# Patient Record
Sex: Female | Born: 1965 | Race: White | Hispanic: No | Marital: Single | Smoking: Never smoker
Health system: Southern US, Community
[De-identification: ages and names within clinical notes are randomized; demographics above are authoritative.]

## PROBLEM LIST (undated history)

## (undated) DIAGNOSIS — L719 Rosacea, unspecified: Secondary | ICD-10-CM

## (undated) DIAGNOSIS — E559 Vitamin D deficiency, unspecified: Secondary | ICD-10-CM

## (undated) DIAGNOSIS — F338 Other recurrent depressive disorders: Secondary | ICD-10-CM

## (undated) DIAGNOSIS — M722 Plantar fascial fibromatosis: Secondary | ICD-10-CM

## (undated) DIAGNOSIS — L111 Transient acantholytic dermatosis [Grover]: Secondary | ICD-10-CM

## (undated) DIAGNOSIS — M1990 Unspecified osteoarthritis, unspecified site: Secondary | ICD-10-CM

## (undated) HISTORY — DX: Other recurrent depressive disorders: F33.8

## (undated) HISTORY — PX: HERNIA REPAIR: SHX51

## (undated) HISTORY — DX: Rosacea, unspecified: L71.9

## (undated) HISTORY — DX: Transient acantholytic dermatosis (grover): L11.1

## (undated) HISTORY — PX: MANDIBLE SURGERY: SHX707

## (undated) HISTORY — DX: Unspecified osteoarthritis, unspecified site: M19.90

## (undated) HISTORY — DX: Vitamin D deficiency, unspecified: E55.9

## (undated) HISTORY — DX: Plantar fascial fibromatosis: M72.2

## (undated) HISTORY — PX: TONSILLECTOMY AND ADENOIDECTOMY: SUR1326

---
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
## 2001-08-15 ENCOUNTER — Other Ambulatory Visit: Admission: RE | Admit: 2001-08-15 | Discharge: 2001-08-15 | Payer: Self-pay | Admitting: Family Medicine

## 2006-06-11 ENCOUNTER — Encounter: Payer: Self-pay | Admitting: Family Medicine

## 2006-07-10 ENCOUNTER — Encounter: Payer: Self-pay | Admitting: Family Medicine

## 2007-12-16 ENCOUNTER — Encounter: Payer: Self-pay | Admitting: Family Medicine

## 2007-12-16 ENCOUNTER — Encounter (INDEPENDENT_AMBULATORY_CARE_PROVIDER_SITE_OTHER): Payer: Self-pay | Admitting: *Deleted

## 2007-12-16 LAB — CONVERTED CEMR LAB
ALT: 46 units/L
AST: 25 units/L
Albumin: 4.5 g/dL
Alkaline Phosphatase: 60 units/L
BUN: 13 mg/dL
CO2, serum: 22 mmol/L
Calcium: 9.6 mg/dL
Chloride, Serum: 103 mmol/L
Cholesterol: 166 mg/dL
Creatinine, Ser: 0.91 mg/dL
Globulin: 2.7 g/dL
Glucose, Bld: 88 mg/dL
HDL: 52 mg/dL
LDL Cholesterol: 99 mg/dL
Pap Smear: NORMAL
Potassium, serum: 3.9 mmol/L
Sodium, serum: 138 mmol/L
TSH: 1.96 microintl units/mL
Total Bilirubin: 0.4 mg/dL
Total Protein: 7.2 g/dL
Triglycerides: 73 mg/dL

## 2008-08-29 ENCOUNTER — Encounter: Admission: RE | Admit: 2008-08-29 | Discharge: 2008-08-29 | Payer: Self-pay | Admitting: Orthopedic Surgery

## 2008-12-31 ENCOUNTER — Encounter (INDEPENDENT_AMBULATORY_CARE_PROVIDER_SITE_OTHER): Payer: Self-pay | Admitting: *Deleted

## 2009-01-01 ENCOUNTER — Ambulatory Visit: Payer: Self-pay | Admitting: Family Medicine

## 2009-01-01 ENCOUNTER — Encounter: Payer: Self-pay | Admitting: Family Medicine

## 2009-01-01 ENCOUNTER — Other Ambulatory Visit: Admission: RE | Admit: 2009-01-01 | Discharge: 2009-01-01 | Payer: Self-pay | Admitting: Family Medicine

## 2009-01-01 DIAGNOSIS — L719 Rosacea, unspecified: Secondary | ICD-10-CM | POA: Insufficient documentation

## 2009-01-01 HISTORY — DX: Rosacea, unspecified: L71.9

## 2009-01-11 ENCOUNTER — Encounter (INDEPENDENT_AMBULATORY_CARE_PROVIDER_SITE_OTHER): Payer: Self-pay | Admitting: *Deleted

## 2009-01-11 LAB — CONVERTED CEMR LAB
ALT: 23 units/L (ref 0–35)
AST: 20 units/L (ref 0–37)
Albumin: 4.2 g/dL (ref 3.5–5.2)
Alkaline Phosphatase: 56 units/L (ref 39–117)
BUN: 8 mg/dL (ref 6–23)
Basophils Absolute: 0.1 10*3/uL (ref 0.0–0.1)
Basophils Relative: 1.4 % (ref 0.0–3.0)
Bilirubin, Direct: 0 mg/dL (ref 0.0–0.3)
CO2: 30 meq/L (ref 19–32)
Calcium: 8.9 mg/dL (ref 8.4–10.5)
Chloride: 101 meq/L (ref 96–112)
Cholesterol: 177 mg/dL (ref 0–200)
Creatinine, Ser: 0.9 mg/dL (ref 0.4–1.2)
Eosinophils Absolute: 0.1 10*3/uL (ref 0.0–0.7)
Eosinophils Relative: 1.6 % (ref 0.0–5.0)
GFR calc non Af Amer: 71.92 mL/min (ref >60)
Glucose, Bld: 92 mg/dL (ref 70–99)
HCT: 40.1 % (ref 36.0–46.0)
HDL: 41.5 mg/dL (ref >39.00)
Hemoglobin: 14.2 g/dL (ref 12.0–15.0)
LDL Cholesterol: 121 mg/dL — ABNORMAL HIGH (ref 0–99)
Lymphocytes Relative: 22.7 % (ref 12.0–46.0)
Lymphs Abs: 1.3 10*3/uL (ref 0.7–4.0)
MCHC: 35.3 g/dL (ref 30.0–36.0)
MCV: 95.6 fL (ref 78.0–100.0)
Monocytes Absolute: 0.5 10*3/uL (ref 0.1–1.0)
Monocytes Relative: 8.9 % (ref 3.0–12.0)
Neutro Abs: 3.7 10*3/uL (ref 1.4–7.7)
Neutrophils Relative %: 65.4 % (ref 43.0–77.0)
Platelets: 256 10*3/uL (ref 150.0–400.0)
Potassium: 4 meq/L (ref 3.5–5.1)
RBC: 4.2 M/uL (ref 3.87–5.11)
RDW: 11.7 % (ref 11.5–14.6)
Sodium: 141 meq/L (ref 135–145)
TSH: 1.18 microintl units/mL (ref 0.35–5.50)
Total Bilirubin: 0.9 mg/dL (ref 0.3–1.2)
Total CHOL/HDL Ratio: 4
Total Protein: 7.6 g/dL (ref 6.0–8.3)
Triglycerides: 74 mg/dL (ref 0.0–149.0)
VLDL: 14.8 mg/dL (ref 0.0–40.0)
WBC: 5.7 10*3/uL (ref 4.5–10.5)

## 2010-03-24 ENCOUNTER — Other Ambulatory Visit
Admission: RE | Admit: 2010-03-24 | Discharge: 2010-03-24 | Payer: Self-pay | Source: Home / Self Care | Admitting: Internal Medicine

## 2010-03-24 ENCOUNTER — Other Ambulatory Visit: Payer: Self-pay

## 2012-05-21 ENCOUNTER — Other Ambulatory Visit: Payer: Self-pay | Admitting: Dermatology

## 2012-10-15 ENCOUNTER — Ambulatory Visit (HOSPITAL_COMMUNITY)
Admission: RE | Admit: 2012-10-15 | Discharge: 2012-10-15 | Disposition: A | Discharge status: Home / Self Care | Payer: BC Managed Care – PPO | Source: Ambulatory Visit | Attending: Physician Assistant | Admitting: Physician Assistant

## 2012-10-15 ENCOUNTER — Other Ambulatory Visit (HOSPITAL_COMMUNITY): Payer: Self-pay | Admitting: Physician Assistant

## 2012-10-15 DIAGNOSIS — M47812 Spondylosis without myelopathy or radiculopathy, cervical region: Secondary | ICD-10-CM | POA: Insufficient documentation

## 2012-10-15 DIAGNOSIS — M5030 Other cervical disc degeneration, unspecified cervical region: Secondary | ICD-10-CM | POA: Insufficient documentation

## 2012-10-15 DIAGNOSIS — S139XXA Sprain of joints and ligaments of unspecified parts of neck, initial encounter: Secondary | ICD-10-CM

## 2012-10-15 DIAGNOSIS — M542 Cervicalgia: Secondary | ICD-10-CM | POA: Insufficient documentation

## 2012-10-23 ENCOUNTER — Other Ambulatory Visit: Payer: Self-pay | Admitting: Internal Medicine

## 2012-10-23 DIAGNOSIS — M542 Cervicalgia: Secondary | ICD-10-CM

## 2012-10-31 ENCOUNTER — Other Ambulatory Visit: Payer: BC Managed Care – PPO

## 2013-05-02 ENCOUNTER — Encounter: Payer: Self-pay | Admitting: Physician Assistant

## 2013-05-02 ENCOUNTER — Other Ambulatory Visit (HOSPITAL_COMMUNITY)
Admission: RE | Admit: 2013-05-02 | Discharge: 2013-05-02 | Disposition: A | Discharge status: Home / Self Care | Payer: BC Managed Care – PPO | Source: Ambulatory Visit | Attending: Physician Assistant | Admitting: Physician Assistant

## 2013-05-02 ENCOUNTER — Ambulatory Visit (INDEPENDENT_AMBULATORY_CARE_PROVIDER_SITE_OTHER): Payer: BC Managed Care – PPO | Admitting: Physician Assistant

## 2013-05-02 VITALS — BP 110/62 | HR 64 | Temp 97.9°F | Resp 16 | Ht 65.0 in | Wt 159.0 lb

## 2013-05-02 DIAGNOSIS — Z124 Encounter for screening for malignant neoplasm of cervix: Secondary | ICD-10-CM | POA: Insufficient documentation

## 2013-05-02 DIAGNOSIS — E559 Vitamin D deficiency, unspecified: Secondary | ICD-10-CM

## 2013-05-02 DIAGNOSIS — Z1151 Encounter for screening for human papillomavirus (HPV): Secondary | ICD-10-CM | POA: Insufficient documentation

## 2013-05-02 DIAGNOSIS — Z79899 Other long term (current) drug therapy: Secondary | ICD-10-CM

## 2013-05-02 DIAGNOSIS — L309 Dermatitis, unspecified: Secondary | ICD-10-CM

## 2013-05-02 DIAGNOSIS — Z0000 Encounter for general adult medical examination without abnormal findings: Secondary | ICD-10-CM

## 2013-05-02 LAB — MICROALBUMIN / CREATININE URINE RATIO
Creatinine, Urine: 149 mg/dL
Microalb Creat Ratio: 3.4 mg/g (ref 0.0–30.0)
Microalb, Ur: 0.5 mg/dL (ref 0.00–1.89)

## 2013-05-02 LAB — URINALYSIS, ROUTINE W REFLEX MICROSCOPIC
Bilirubin Urine: NEGATIVE
Glucose, UA: NEGATIVE mg/dL
Ketones, ur: NEGATIVE mg/dL
Leukocytes, UA: NEGATIVE
NITRITE: NEGATIVE
Protein, ur: NEGATIVE mg/dL
Specific Gravity, Urine: 1.019 (ref 1.005–1.030)
UROBILINOGEN UA: 0.2 mg/dL (ref 0.0–1.0)
pH: 5.5 (ref 5.0–8.0)

## 2013-05-02 LAB — URINALYSIS, MICROSCOPIC ONLY
Bacteria, UA: NONE SEEN
Casts: NONE SEEN
Crystals: NONE SEEN
SQUAMOUS EPITHELIAL / LPF: NONE SEEN

## 2013-05-02 LAB — HEMOGLOBIN A1C
HEMOGLOBIN A1C: 5.4 % (ref <5.7)
Mean Plasma Glucose: 108 mg/dL (ref <117)

## 2013-05-02 LAB — CBC WITH DIFFERENTIAL/PLATELET
BASOS ABS: 0 10*3/uL (ref 0.0–0.1)
Basophils Relative: 0 % (ref 0–1)
EOS PCT: 2 % (ref 0–5)
Eosinophils Absolute: 0.1 10*3/uL (ref 0.0–0.7)
HEMATOCRIT: 40.9 % (ref 36.0–46.0)
Hemoglobin: 14.1 g/dL (ref 12.0–15.0)
LYMPHS PCT: 28 % (ref 12–46)
Lymphs Abs: 1.4 10*3/uL (ref 0.7–4.0)
MCH: 31.1 pg (ref 26.0–34.0)
MCHC: 34.5 g/dL (ref 30.0–36.0)
MCV: 90.3 fL (ref 78.0–100.0)
MONO ABS: 0.5 10*3/uL (ref 0.1–1.0)
Monocytes Relative: 10 % (ref 3–12)
Neutro Abs: 3 10*3/uL (ref 1.7–7.7)
Neutrophils Relative %: 60 % (ref 43–77)
Platelets: 241 10*3/uL (ref 150–400)
RBC: 4.53 MIL/uL (ref 3.87–5.11)
RDW: 13.2 % (ref 11.5–15.5)
WBC: 5 10*3/uL (ref 4.0–10.5)

## 2013-05-02 LAB — HEPATIC FUNCTION PANEL
ALBUMIN: 4.4 g/dL (ref 3.5–5.2)
ALK PHOS: 56 U/L (ref 39–117)
ALT: 13 U/L (ref 0–35)
AST: 12 U/L (ref 0–37)
Bilirubin, Direct: 0.1 mg/dL (ref 0.0–0.3)
Indirect Bilirubin: 0.6 mg/dL (ref 0.2–1.2)
TOTAL PROTEIN: 7.1 g/dL (ref 6.0–8.3)
Total Bilirubin: 0.7 mg/dL (ref 0.2–1.2)

## 2013-05-02 LAB — LIPID PANEL
CHOLESTEROL: 167 mg/dL (ref 0–200)
HDL: 54 mg/dL (ref >39)
LDL Cholesterol: 100 mg/dL — ABNORMAL HIGH (ref 0–99)
Total CHOL/HDL Ratio: 3.1 Ratio
Triglycerides: 63 mg/dL (ref <150)
VLDL: 13 mg/dL (ref 0–40)

## 2013-05-02 LAB — VITAMIN B12: Vitamin B-12: 459 pg/mL (ref 211–911)

## 2013-05-02 LAB — FERRITIN: FERRITIN: 65 ng/mL (ref 10–291)

## 2013-05-02 LAB — BASIC METABOLIC PANEL WITH GFR
BUN: 11 mg/dL (ref 6–23)
CHLORIDE: 103 meq/L (ref 96–112)
CO2: 26 mEq/L (ref 19–32)
CREATININE: 0.88 mg/dL (ref 0.50–1.10)
Calcium: 9.4 mg/dL (ref 8.4–10.5)
GFR, EST NON AFRICAN AMERICAN: 77 mL/min
GFR, Est African American: 89 mL/min
Glucose, Bld: 83 mg/dL (ref 70–99)
Potassium: 4.4 mEq/L (ref 3.5–5.3)
Sodium: 137 mEq/L (ref 135–145)

## 2013-05-02 LAB — IRON AND TIBC
%SAT: 36 % (ref 20–55)
Iron: 113 ug/dL (ref 42–145)
TIBC: 314 ug/dL (ref 250–470)
UIBC: 201 ug/dL (ref 125–400)

## 2013-05-02 LAB — MAGNESIUM: Magnesium: 2 mg/dL (ref 1.5–2.5)

## 2013-05-02 LAB — TSH: TSH: 1.894 u[IU]/mL (ref 0.350–4.500)

## 2013-05-02 MED ORDER — TRIAMCINOLONE ACETONIDE 0.1 % EX CREA: 1.0000 "application " | TOPICAL_CREAM | Freq: Two times a day (BID) | CUTANEOUS | Status: DC

## 2013-05-02 NOTE — Patient Instructions (Addendum)
Preventative Care for Adults - Female      MAINTAIN REGULAR HEALTH EXAMS:  A routine yearly physical is a good way to check in with your primary care provider about your health and preventive screening. It is also an opportunity to share updates about your health and any concerns you have, and receive a thorough all-over exam.   Most health insurance companies pay for at least some preventative services.  Check with your health plan for specific coverages.  WHAT PREVENTATIVE SERVICES DO WOMEN NEED?  Adult women should have their weight and blood pressure checked regularly.   Women age 35 and older should have their cholesterol levels checked regularly.  Women should be screened for cervical cancer with a Pap smear and pelvic exam beginning at either age 21, or 3 years after they become sexually activity.    Breast cancer screening generally begins at age 40 with a mammogram and breast exam by your primary care provider.    Beginning at age 50 and continuing to age 75, women should be screened for colorectal cancer.  Certain people may need continued testing until age 85.  Updating vaccinations is part of preventative care.  Vaccinations help protect against diseases such as the flu.  Osteoporosis is a disease in which the bones lose minerals and strength as we age. Women ages 65 and over should discuss this with their caregivers, as should women after menopause who have other risk factors.  Lab tests are generally done as part of preventative care to screen for anemia and blood disorders, to screen for problems with the kidneys and liver, to screen for bladder problems, to check blood sugar, and to check your cholesterol level.  Preventative services generally include counseling about diet, exercise, avoiding tobacco, drugs, excessive alcohol consumption, and sexually transmitted infections.    GENERAL RECOMMENDATIONS FOR GOOD HEALTH:  Healthy diet:  Eat a variety of foods, including  fruit, vegetables, animal or vegetable protein, such as meat, fish, chicken, and eggs, or beans, lentils, tofu, and grains, such as rice.  Drink plenty of water daily.  Decrease saturated fat in the diet, avoid lots of red meat, processed foods, sweets, fast foods, and fried foods.  Exercise:  Aerobic exercise helps maintain good heart health. At least 30-40 minutes of moderate-intensity exercise is recommended. For example, a brisk walk that increases your heart rate and breathing. This should be done on most days of the week.   Find a type of exercise or a variety of exercises that you enjoy so that it becomes a part of your daily life.  Examples are running, walking, swimming, water aerobics, and biking.  For motivation and support, explore group exercise such as aerobic class, spin class, Zumba, Yoga,or  martial arts, etc.    Set exercise goals for yourself, such as a certain weight goal, walk or run in a race such as a 5k walk/run.  Speak to your primary care provider about exercise goals.  Disease prevention:  If you smoke or chew tobacco, find out from your caregiver how to quit. It can literally save your life, no matter how long you have been a tobacco user. If you do not use tobacco, never begin.   Maintain a healthy diet and normal weight. Increased weight leads to problems with blood pressure and diabetes.   The Body Mass Index or BMI is a way of measuring how much of your body is fat. Having a BMI above 27 increases the risk of heart disease,   diabetes, hypertension, stroke and other problems related to obesity. Your caregiver can help determine your BMI and based on it develop an exercise and dietary program to help you achieve or maintain this important measurement at a healthful level.  High blood pressure causes heart and blood vessel problems.  Persistent high blood pressure should be treated with medicine if weight loss and exercise do not work.   Fat and cholesterol leaves  deposits in your arteries that can block them. This causes heart disease and vessel disease elsewhere in your body.  If your cholesterol is found to be high, or if you have heart disease or certain other medical conditions, then you may need to have your cholesterol monitored frequently and be treated with medication.   Ask if you should have a cardiac stress test if your history suggests this. A stress test is a test done on a treadmill that looks for heart disease. This test can find disease prior to there being a problem.  Menopause can be associated with physical symptoms and risks. Hormone replacement therapy is available to decrease these. You should talk to your caregiver about whether starting or continuing to take hormones is right for you.   Osteoporosis is a disease in which the bones lose minerals and strength as we age. This can result in serious bone fractures. Risk of osteoporosis can be identified using a bone density scan. Women ages 65 and over should discuss this with their caregivers, as should women after menopause who have other risk factors. Ask your caregiver whether you should be taking a calcium supplement and Vitamin D, to reduce the rate of osteoporosis.   Avoid drinking alcohol in excess (more than two drinks per day).  Avoid use of street drugs. Do not share needles with anyone. Ask for professional help if you need assistance or instructions on stopping the use of alcohol, cigarettes, and/or drugs.  Brush your teeth twice a day with fluoride toothpaste, and floss once a day. Good oral hygiene prevents tooth decay and gum disease. The problems can be painful, unattractive, and can cause other health problems. Visit your dentist for a routine oral and dental check up and preventive care every 6-12 months.   Look at your skin regularly.  Use a mirror to look at your back. Notify your caregivers of changes in moles, especially if there are changes in shapes, colors, a size  larger than a pencil eraser, an irregular border, or development of new moles.  Safety:  Use seatbelts 100% of the time, whether driving or as a passenger.  Use safety devices such as hearing protection if you work in environments with loud noise or significant background noise.  Use safety glasses when doing any work that could send debris in to the eyes.  Use a helmet if you ride a bike or motorcycle.  Use appropriate safety gear for contact sports.  Talk to your caregiver about gun safety.  Use sunscreen with a SPF (or skin protection factor) of 15 or greater.  Lighter skinned people are at a greater risk of skin cancer. Don't forget to also wear sunglasses in order to protect your eyes from too much damaging sunlight. Damaging sunlight can accelerate cataract formation.   Practice safe sex. Use condoms. Condoms are used for birth control and to help reduce the spread of sexually transmitted infections (or STIs).  Some of the STIs are gonorrhea (the clap), chlamydia, syphilis, trichomonas, herpes, HPV (human papilloma virus) and HIV (human immunodeficiency virus)   which causes AIDS. The herpes, HIV and HPV are viral illnesses that have no cure. These can result in disability, cancer and death.   Keep carbon monoxide and smoke detectors in your home functioning at all times. Change the batteries every 6 months or use a model that plugs into the wall.   Vaccinations:  Stay up to date with your tetanus shots and other required immunizations. You should have a booster for tetanus every 10 years. Be sure to get your flu shot every year, since 5%-20% of the [place] population comes down with the flu. The flu vaccine changes each year, so being vaccinated once is not enough. Get your shot in the fall, before the flu season peaks.   Other vaccines to consider:  Human Papilloma Virus or HPV causes cancer of the cervix, and other infections that can be transmitted from person to person. There is a vaccine for  HPV, and females should get immunized between the ages of 89 and 107. It requires a series of 3 shots.   Pneumococcal vaccine to protect against certain types of pneumonia.  This is normally recommended for adults age 8 or older.  However, adults younger than 50 years old with certain underlying conditions such as diabetes, heart or lung disease should also receive the vaccine.  Shingles vaccine to protect against Varicella Zoster if you are older than age 73, or younger than 50 years old with certain underlying illness.  Hepatitis A vaccine to protect against a form of infection of the liver by a virus acquired from food.  Hepatitis B vaccine to protect against a form of infection of the liver by a virus acquired from blood or body fluids, particularly if you work in health care.  If you plan to travel internationally, check with your local health department for specific vaccination recommendations.  Cancer Screening:  Breast cancer screening is essential to preventive care for women. All women age 1 and older should perform a breast self-exam every month. At age 69 and older, women should have their caregiver complete a breast exam each year. Women at ages 49 and older should have a mammogram (x-ray film) of the breasts. Your caregiver can discuss how often you need mammograms.    Cervical cancer screening includes taking a Pap smear (sample of cells examined under a microscope) from the cervix (end of the uterus). It also includes testing for HPV (Human Papilloma Virus, which can cause cervical cancer). Screening and a pelvic exam should begin at age 62, or 3 years after a woman becomes sexually active. Screening should occur every year, with a Pap smear but no HPV testing, up to age 73. After age 52, you should have a Pap smear every 3 years with HPV testing, if no HPV was found previously.   Most routine colon cancer screening begins at the age of 52. On a yearly basis, doctors may provide  special easy to use take-home tests to check for hidden blood in the stool. Sigmoidoscopy or colonoscopy can detect the earliest forms of colon cancer and is life saving. These tests use a small camera at the end of a tube to directly examine the colon. Speak to your caregiver about this at age 19, when routine screening begins (and is repeated every 5 years unless early forms of pre-cancerous polyps or small growths are found).    VAGINAL DRYNESS OVERVIEW  Vaginal dryness, also known as atrophic vaginitis, is a common condition in postmenopausal women. This condition is also  common in women who have had both ovaries removed at the time of hysterectomy.   Some women have uncomfortable symptoms of vaginal dryness, such as pain with sex, burning vaginal discomfort or itching, or abnormal vaginal discharge, while others have no symptoms at all.  VAGINAL DRYNESS CAUSES   Estrogen helps to keep the vagina moist and to maintain thickness of the vaginal lining. Vaginal dryness occurs when the ovaries produce a decreased amount of estrogen. This can occur at certain times in a woman's life, and may be permanent or temporary. Times when less estrogen is made include: ?At the time of menopause. ?After surgical removal of the ovaries, chemotherapy, or radiation therapy of the pelvis for cancer. ?After having a baby, particularly in women who breastfeed. ?While using certain medications, such as danazol, medroxyprogesterone (brand names: Provera or DepoProvera), leuprolide (brand name: Lupron), or nafarelin. When these medications are stopped, estrogen production resumes.  Women who smoke cigarettes have been shown to have an increased risk of an earlier menopause transition as compared to non-smokers. Therefore, atrophic vaginitis symptoms may appear at a younger age in this population.  VAGINAL DRYNESS TREATMENT   There are three treatment options for women with vaginal dryness:  Vaginal lubricants and  moisturizers - Vaginal lubricants and moisturizers can be purchased without a prescription. These products do not contain any hormones and have virtually no side effects. - Albolene is found in the facial cleanser section at [facility], [facility], or [employer]. It is a large jar with a blue top. This is the best lubricant for women because it is hypoallergenic. -Natural lubricants, such as olive, avocado or peanut oil, are easily available products that may be used as a lubricant with sex.  -Vaginal moisturizes (eg, Replens, Moist Again, Vagisil, K-Y Silk-E, and Feminease) are formulated to allow water to be retained in the vaginal tissues. Moisturizers are applied into the vagina three times weekly to allow a continued moisturizing effect. These should not be used just before having sex, as they can be irritating.  Vaginal estrogen - Vaginal estrogen is the most effective treatment option for women with vaginal dryness. Vaginal estrogen must be prescribed by a healthcare provider. Very low doses of vaginal estrogen can be used when it is put into the vagina to treat vaginal dryness. A small amount of estrogen is absorbed into the bloodstream, but only about 100 times less than when using estrogen pills or tablets. As a result, there is a much lower risk of side effects, such as blood clots, breast cancer, and heart attack, compared with other estrogen-containing products (birth control pills, menopausal hormone therapy).   Ospemifene - Ospemifene is a prescription medication that is similar to estrogen, but is not estrogen. In the vaginal tissue, it acts similarly to estrogen. In the breast tissue, it acts as an estrogen blocker. It comes in a pill, and is prescribed for women who want to use an estrogen-like medication for vaginal dryness or painful sex associated with vaginal dryness, but prefer not to use a vaginal medication. The medication may cause hot flashes as a side effect. This type of medication may  increase the risk of blood clots or uterine cancer. Further study of ospemifene is needed to evaluate the risk of these complications. This medication has not been tested in women who have had breast cancer or are at a high risk of developing breast cancer.    Sexual activity - Vaginal estrogen improves vaginal dryness quickly, usually within a few weeks. You may  continue to have sex as you treat vaginal dryness because sex itself can help to keep the vaginal tissues healthy. Vaginal intercourse may help the vaginal tissues by keeping them soft and stretchable and preventing the tissues from shrinking.  If sex continues to be painful despite treatment for vaginal dryness, talk to your healthcare provider.

## 2013-05-02 NOTE — Progress Notes (Signed)
Complete Physical HPI 48 y.o. female  presents for a complete physical. Her blood pressure has been controlled at home, today their BP is BP: 110/62 mmHg She does workout. She denies chest pain, shortness of breath, dizziness.   Her cholesterol is at goal. The cholesterol last visit was: LDL 109   A1C 5.6, Vitamin D 19.  Menses regular. LMP 04/25/13 Use to see dermatologist for eczema/dry skin, complains of it still.   Current Medications:  None, aleve/tylenol occ  Health Maintenance:  Immunization History  Administered Date(s) Administered  . Tdap 05/01/2012   Pneumovax: N/A Flu vaccine: declines Zostavax:N/A Pap:DUE 2011 MGM: DUE DEXA: N/A Colonoscopy: this year EGD: N/A  Allergies: Allergies no known allergies Medical History:  Past Medical History  Diagnosis Date  . Vitamin D deficiency   . Plantar fasciitis   . Grover's disease   . Seasonal affective disorder    Surgical History:  Past Surgical History  Procedure Laterality Date  . Tonsillectomy and adenoidectomy    . Hernia repair      Umbilical  . Mandible surgery     Family History:  Family History  Problem Relation Age of Onset  . Hypertension Mother   . Peptic Ulcer Disease Father   . Cancer Father     prostate  . Asthma Sister   . Arthritis Sister    Social History:  History  Substance Use Topics  . Smoking status: Never Smoker   . Smokeless tobacco: Never Used  . Alcohol Use: No    Review of Systems: [X]  = complains of  [ ]  = denies  General: Fatigue [ ]  Fever [ ]  Chills [ ]  Weakness [ ]   Insomnia [ ]  Weight change [ ]  Night sweats [ ]   Change in appetite [ ]  Eyes: Dr. Satira Sark 08/2012 contacts, yearly  Redness [ ]  Blurred vision [ ]  Diplopia [ ]  Discharge [ ]   ENT: Dr. Benjamine Mola q 4 months Congestion [ ]  Sinus Pain [ ]  Post Nasal Drip [ ]  Sore Throat [ ]  Earache [ ]  hearing loss [ ]  Tinnitus [ ]  Snoring [ ]   Cardiac: Chest pain/pressure [ ]  SOB [ ]  Orthopnea [ ]   Palpitations [ ]   Paroxysmal  nocturnal dyspnea[ ]  Claudication [ ]  Edema [ ]   Pulmonary: Cough [ ]  Wheezing[ ]   SOB [ ]   Pleurisy [ ]   GI: Nausea [ ]  Vomiting[ ]  Dysphagia[ ]  Heartburn[ ]  Abdominal pain [ ]  Constipation [ ] ; Diarrhea [ ]  BRBPR [ ]  Melena[ ]  Bloating [ ]  Hemorrhoids [ ]   GU: Hematuria[ ]  Dysuria [ ]  Nocturia[ ]  Urgency [ ]   Hesitancy [ ]  Discharge [ ]  Frequency [ ]   Breast:  Breast lumps [ ]   nipple discharge [ ]    Neuro: Headaches[ ]  Vertigo[ ]  Paresthesias[ ]  Spasm [ ]  Speech changes [ ]  Incoordination [ ]   Ortho: Arthritis [ ]  Joint pain [ ]  Muscle pain [ ]  Joint swelling [ ]  Back Pain [ ]  Skin:  Rash Valu.Nieves ]  Pruritis [ ]  Change in skin lesion [ ]   Psych: Depression[X ] Anxiety[ ]  Confusion [ ]  Memory loss [ ]   Heme/Lypmh: Bleeding [ ]  Bruising [ ]  Enlarged lymph nodes [ ]   Endocrine: Visual blurring [ ]  Paresthesia [ ]  Polyuria [ ]  Polydypsea [ ]  Heat/cold intolerance [ ]  Hypoglycemia [ ]   Physical Exam: Estimated body mass index is 26.46 kg/(m^2) as calculated from the following:   Height as of this encounter: 5\' 5"  (1.651  m).   Weight as of this encounter: 159 lb (72.122 kg). Filed Vitals:   05/02/13 0930  BP: 110/62  Pulse: 64  Temp: 97.9 F (36.6 C)  Resp: 16   General Appearance: Well nourished, in no apparent distress. Eyes: PERRLA, EOMs, conjunctiva no swelling or erythema, normal fundi and vessels. Sinuses: No Frontal/maxillary tenderness ENT/Mouth: Ext aud canals clear, normal light reflex with TMs without erythema, bulging.  Good dentition. No erythema, swelling, or exudate on post pharynx. Tonsils not swollen or erythematous. Hearing normal.  Neck: Supple, thyroid normal. No bruits Respiratory: Respiratory effort normal, BS equal bilaterally without rales, rhonchi, wheezing or stridor. Cardio: RRR without murmurs, rubs or gallops. Brisk peripheral pulses without edema.  Chest: symmetric, with normal excursions and percussion. Breasts: Symmetric, with some mobile, tender lumps  bilateral breast, nipple discharge, retractions. Abdomen: Soft, +BS. Non tender, no guarding, rebound, hernias, masses, or organomegaly. .  Lymphatics: Non tender without lymphadenopathy.  Genitourinary: Some blood today from cervix, some vaginal atrophy, cervix appeared normal Musculoskeletal: Full ROM all peripheral extremities,5/5 strength, and normal gait. Skin: Mild scaly erythematous rash on nape of neck Warm, dry without  lesions, ecchymosis.  Neuro: Cranial nerves intact, reflexes equal bilaterally. Normal muscle tone, no cerebellar symptoms. Sensation intact.  Psych: Awake and oriented X 3, normal affect, Insight and Judgment appropriate.   EKG: WNL no changes.  Assessment and Plan: Vitamin D deficiency- get on it for mood and bone  Plantar fasciitis- controlled  Grover's disease- controlled  Seasonal affective disorder- workout, declines meds  Eczema- send in triamcinolone 0.1% cream with refills + depression screening- states from winter/decreased day light, discussed medications but declines at this time, discussed light therapy and exercise. Will monitor at 6 month.  Get MGM Get colonoscopy when turn 48 PAP today  Discussed med's effects and SE's. Screening labs and tests as requested with regular follow-up as recommended.   Vicie Mutters 9:39 AM

## 2013-05-03 LAB — INSULIN, FASTING: Insulin fasting, serum: 6 u[IU]/mL (ref 3–28)

## 2013-05-03 LAB — VITAMIN D 25 HYDROXY (VIT D DEFICIENCY, FRACTURES): VIT D 25 HYDROXY: 32 ng/mL (ref 30–89)

## 2013-10-31 ENCOUNTER — Ambulatory Visit: Payer: Self-pay | Admitting: Physician Assistant

## 2014-05-04 ENCOUNTER — Encounter: Payer: Self-pay | Admitting: Physician Assistant

## 2014-05-04 ENCOUNTER — Ambulatory Visit (INDEPENDENT_AMBULATORY_CARE_PROVIDER_SITE_OTHER): Payer: BLUE CROSS/BLUE SHIELD | Admitting: Physician Assistant

## 2014-05-04 VITALS — BP 110/72 | HR 72 | Temp 98.1°F | Resp 16 | Ht 65.0 in | Wt 166.0 lb

## 2014-05-04 DIAGNOSIS — M1990 Unspecified osteoarthritis, unspecified site: Secondary | ICD-10-CM | POA: Insufficient documentation

## 2014-05-04 DIAGNOSIS — F338 Other recurrent depressive disorders: Secondary | ICD-10-CM

## 2014-05-04 DIAGNOSIS — R5383 Other fatigue: Secondary | ICD-10-CM

## 2014-05-04 DIAGNOSIS — R6889 Other general symptoms and signs: Secondary | ICD-10-CM

## 2014-05-04 DIAGNOSIS — F39 Unspecified mood [affective] disorder: Secondary | ICD-10-CM

## 2014-05-04 DIAGNOSIS — L719 Rosacea, unspecified: Secondary | ICD-10-CM

## 2014-05-04 DIAGNOSIS — Z1211 Encounter for screening for malignant neoplasm of colon: Secondary | ICD-10-CM

## 2014-05-04 DIAGNOSIS — Z0001 Encounter for general adult medical examination with abnormal findings: Secondary | ICD-10-CM

## 2014-05-04 DIAGNOSIS — E559 Vitamin D deficiency, unspecified: Secondary | ICD-10-CM

## 2014-05-04 DIAGNOSIS — L111 Transient acantholytic dermatosis [Grover]: Secondary | ICD-10-CM | POA: Insufficient documentation

## 2014-05-04 LAB — TSH: TSH: 2.329 u[IU]/mL (ref 0.350–4.500)

## 2014-05-04 NOTE — Patient Instructions (Addendum)
[facility]  7 a.m.-6:30 p.m., Monday 7 a.m.-5 p.m., Tuesday-Friday Schedule an appointment by calling [phone].  [facility] Schedule an appointment by calling [phone].  Use a dropper or use a cap to put olive oil,mineral oil or canola oil in the effected ear- 2-3 times a week. Let it soak for 20-30 min then you can take a shower or use a baby bulb with warm water to wash out the ear wax.  Do not use Qtips  VAGINAL DRYNESS OVERVIEW  Vaginal dryness, also known as atrophic vaginitis, is a common condition in postmenopausal women. This condition is also common in women who have had both ovaries removed at the time of hysterectomy.   Some women have uncomfortable symptoms of vaginal dryness, such as pain with sex, burning vaginal discomfort or itching, or abnormal vaginal discharge, while others have no symptoms at all.  VAGINAL DRYNESS CAUSES   Estrogen helps to keep the vagina moist and to maintain thickness of the vaginal lining. Vaginal dryness occurs when the ovaries produce a decreased amount of estrogen. This can occur at certain times in a woman's life, and may be permanent or temporary. Times when less estrogen is made include: ?At the time of menopause. ?After surgical removal of the ovaries, chemotherapy, or radiation therapy of the pelvis for cancer. ?After having a baby, particularly in women who breastfeed. ?While using certain medications, such as danazol, medroxyprogesterone (brand names: Provera or DepoProvera), leuprolide (brand name: Lupron), or nafarelin. When these medications are stopped, estrogen production resumes.  Women who smoke cigarettes have been shown to have an increased risk of an earlier menopause transition as compared to non-smokers. Therefore, atrophic vaginitis symptoms may appear at a younger age in this population.  VAGINAL DRYNESS TREATMENT   There are three treatment options for women with vaginal dryness:   Vaginal lubricants and moisturizers - Vaginal lubricants and moisturizers can be purchased without a prescription. These products do not contain any hormones and have virtually no side effects. - Albolene is found in the facial cleanser section at [facility], [facility], or [employer]. It is a large jar with a blue top. This is the best lubricant for women because it is hypoallergenic. -Natural lubricants, such as olive, avocado or peanut oil, are easily available products that may be used as a lubricant with sex.  -Vaginal moisturizes (eg, Replens, Moist Again, Vagisil, K-Y Silk-E, and Feminease) are formulated to allow water to be retained in the vaginal tissues. Moisturizers are applied into the vagina three times weekly to allow a continued moisturizing effect. These should not be used just before having sex, as they can be irritating.  Vaginal estrogen - Vaginal estrogen is the most effective treatment option for women with vaginal dryness. Vaginal estrogen must be prescribed by a healthcare provider. Very low doses of vaginal estrogen can be used when it is put into the vagina to treat vaginal dryness. A small amount of estrogen is absorbed into the bloodstream, but only about 100 times less than when using estrogen pills or tablets. As a result, there is a much lower risk of side effects, such as blood clots, breast cancer, and heart attack, compared with other estrogen-containing products (birth control pills, menopausal hormone therapy).  Ospemifene - Ospemifene is a prescription medication that is similar to estrogen, but is not estrogen. In the vaginal tissue, it acts similarly to estrogen. In the breast tissue, it acts as an estrogen blocker. It comes in a pill, and is  prescribed for women who want to use an estrogen-like medication for vaginal dryness or painful sex associated with vaginal dryness, but prefer not to use a vaginal medication. The medication may cause hot flashes as a side effect. This  type of medication may increase the risk of blood clots or uterine cancer. Further study of ospemifene is needed to evaluate the risk of these complications. This medication has not been tested in women who have had breast cancer or are at a high risk of developing breast cancer.   Iliotibial Band Syndrome with Rehab The iliotibial (IT) band is a tendon that connects the hip muscles to the shinbone (tibia) and to one of the bones of the pelvis (ileum). The IT band passes by the knee and is often irritated by the outer portion of the knee (lateral femoral condyle). A fluid filled sac (bursa) exists between the tendon and the bone, to cushion and reduce friction. Overuse of the tendon may cause excessive friction, which results in IT band syndrome. This condition involves inflammation of the bursa (bursitis) and/or inflammation of the IT band (tendinitis). SYMPTOMS   Pain, tenderness, swelling, warmth, or redness over the IT band, at the outer knee (above the joint).  Pain that travels up or down the thigh or leg.  Initially, pain at the beginning of an exercise, that decreases once warmed up. Eventually, pain throughout the activity, getting worse as the activity continues. May cause the athlete to stop in the middle of training or competing.  Pain that gets worse when running down hills or stairs, on banked tracks, or next to the curb on the street.  Pain that increases when the foot of the affected leg hits the ground.  Possibly, a crackling sound (crepitation) when the tendon or bursa is moved or touched. CAUSES  IT band syndrome is caused by irritation of the IT band and the underlying bursa. This eventually results in inflammation and pain. IT band syndrome is an overuse injury.  RISK INCREASES WITH:  Sports with repetitive knee-bending activities (distance running, cycling).  Incorrect training techniques, including sudden changes in the intensity, frequency, or duration of  training.  Not enough rest between workouts.  Poor strength and flexibility, especially a tight IT band.  Failure to warm up properly before activity.  Bow legs.  Arthritis of the knee. PREVENTION   Warm up and stretch properly before activity.  Allow for adequate recovery between workouts.  Maintain physical fitness:  Strength, flexibility, and endurance.  Cardiovascular fitness.  Learn and use proper training technique, including reducing running mileage, shortening stride, and avoiding running on hills and banked surfaces.  Wear arch supports (orthotics), if you have flat feet. PROGNOSIS  If treated properly, IT band syndrome usually goes away within 6 weeks of treatment. RELATED COMPLICATIONS   Longer healing time, if not properly treated, or if not given enough time to heal.  Recurring inflammation of the tendon and bursa, that may result in a chronic condition.  Recurring symptoms, if activity is resumed too soon, with overuse, with a direct blow, or with poor training technique.  Inability to complete training or competition. TREATMENT  Treatment first involves the use of ice and medicine, to reduce pain and inflammation. The use of strengthening and stretching exercises may help reduce pain with activity. These exercises may be performed at home or with a therapist. For individuals with flat feet, an arch support (orthotic) may be helpful. Some individuals find that wearing a knee sleeve or compression  bandage around the knee during workouts provides some relief. Certain training techniques, such as adjusting stride length, avoiding running on hills or stairs, changing the direction you run on a circular or banked track, or changing the side of the road you run on, if you run next to the curb, may help decrease symptoms of IT band syndrome. Cyclists may need to change the seat height or foot position on their bicycles. An injection of cortisone into the bursa may be  recommended. Surgery to remove the inflamed bursa and/or part of the IT band is only considered after at least 6 months of non-surgical treatment.  MEDICATION   If pain medicine is needed, nonsteroidal anti-inflammatory medicines (aspirin and ibuprofen), or other minor pain relievers (acetaminophen), are often advised.  Do not take pain medicine for 7 days before surgery.  Prescription pain relievers may be given, if your caregiver thinks they are needed. Use only as directed and only as much as you need.  Corticosteroid injections may be given by your caregiver. These injections should be reserved for the most serious cases, because they may only be given a certain number of times. HEAT AND COLD  Cold treatment (icing) should be applied for 10 to 15 minutes every 2 to 3 hours for inflammation and pain, and immediately after activity that aggravates your symptoms. Use ice packs or an ice massage.  Heat treatment may be used before performing stretching and strengthening activities prescribed by your caregiver, physical therapist, or athletic trainer. Use a heat pack or a warm water soak. SEEK MEDICAL CARE IF:   Symptoms get worse or do not improve in 2 to 4 weeks, despite treatment.  New, unexplained symptoms develop. (Drugs used in treatment may produce side effects.) EXERCISES  RANGE OF MOTION (ROM) AND STRETCHING EXERCISES - Iliotibial Band Syndrome These exercises may help you when beginning to rehabilitate your injury. Your symptoms may go away with or without further involvement from your physician, physical therapist or athletic trainer. While completing these exercises, remember:   Restoring tissue flexibility helps normal motion to return to the joints. This allows healthier, less painful movement and activity.  An effective stretch should be held for at least 30 seconds.  A stretch should never be painful. You should only feel a gentle lengthening or release in the stretched  tissue. STRETCH - Quadriceps, Prone   Lie on your stomach on a firm surface, such as a bed or padded floor.  Bend your right / left knee and grasp your ankle. If you are unable to reach your ankle or pant leg, use a belt around your foot to lengthen your reach.  Gently pull your heel toward your buttocks. Your knee should not slide out to the side. You should feel a stretch in the front of your thigh and knee.  Hold this position for __________ seconds. Repeat __________ times. Complete this stretch __________ times per day.  STRETCH - Iliotibial Band  On the floor or bed, lie on your side, so your right / left leg is on top. Bend your knee and grab your ankle.  Slowly bring your knee back so that your thigh is in line with your trunk. Keep your heel at your buttocks and gently arch your back, so your head, shoulders and hips line up.  Slowly lower your leg so that your knee approaches the floor or bed, until you feel a gentle stretch on the outside of your right / left thigh. If you do not feel  a stretch and your knee will not fall farther, place the heel of your opposite foot on top of your knee, and pull your thigh down farther.  Hold this stretch for __________ seconds. Repeat __________ times. Complete this stretch __________ times per day. STRENGTHENING EXERCISES - Iliotibial Band Syndrome Improving the flexibility of the IT band will best relieve your discomfort due to IT band syndrome. Strengthening exercises, however, can help improve both muscle endurance and joint mechanics, reducing the factors that can contribute to this condition. Your physician, physical therapist or athletic trainer may provide you with exercises that train specific muscle groups that are especially weak. The following exercises target muscles that are often weak in people who have IT band syndrome. STRENGTH - Hip Abductors, Straight Leg Raises  Be aware of your form throughout the entire exercise, so that you  exercise the correct muscles. Poor form means that you are not strengthening the correct muscles.  Lie on your side, so that your head, shoulders, knee and hip line up. You may bend your lower knee to help maintain your balance. Your right / left leg should be on top.  Roll your hips slightly forward, so that your hips are stacked directly over each other and your right / left knee is facing forward.  Lift your top leg up 4-6 inches, leading with your heel. Be sure that your foot does not drift forward and that your knee does not roll toward the ceiling.  Hold this position for __________ seconds. You should feel the muscles in your outer hip lifting (you may not notice this until your leg begins to tire).  Slowly lower your leg to the starting position. Allow the muscles to fully relax before beginning the next repetition. Repeat __________ times. Complete this exercise __________ times per day.  STRENGTH - Quad/VMO, Isometric  Sit in a chair with your right / left knee slightly bent. With your fingertips, feel the VMO muscle (just above the inside of your knee). The VMO is important in controlling the position of your kneecap.  Keeping your fingertips on this muscle. Without actually moving your leg, attempt to drive your knee down, as if straightening your leg. You should feel your VMO tense. If you have a difficult time, you may wish to try the same exercise on your healthy knee first.  Tense this muscle as hard as you can, without increasing any knee pain.  Hold for __________ seconds. Relax the muscles slowly and completely between each repetition. Repeat __________ times. Complete this exercise __________ times per day.  Document Released: 02/20/2005 Document Revised: 05/15/2011 Document Reviewed: 06/04/2008 [facility] Patient Information 2015 [place], [place]. This information is not intended to replace advice given to you by your health care provider. Make sure you discuss any questions  you have with your health care provider.  Sexual activity - Vaginal estrogen improves vaginal dryness quickly, usually within a few weeks. You may continue to have sex as you treat vaginal dryness because sex itself can help to keep the vaginal tissues healthy. Vaginal intercourse may help the vaginal tissues by keeping them soft and stretchable and preventing the tissues from shrinking.  If sex continues to be painful despite treatment for vaginal dryness, talk to your healthcare provider.

## 2014-05-04 NOTE — Progress Notes (Signed)
Complete Physical  Assessment and Plan: Vitamin D deficiency- get on it for mood and bone  Plantar fasciitis- controlled  Grover's disease- controlled  Seasonal affective disorder- workout, declines meds  + depression screening- states from winter/decreased day light, discussed medications but declines at this time, discussed light therapy and exercise. 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 Screening labs and tests as requested with regular follow-up as recommended. HPI 49 y.o. female  presents for a complete physical. Her blood pressure has been controlled at home, today their BP is BP: 110/72 mmHg She does workout. She denies chest pain, shortness of breath, dizziness.   Her cholesterol is at goal. The cholesterol last visit was:  Lab Results  Component Value Date   CHOL 167 05/02/2013   HDL 54 05/02/2013   LDLCALC 100* 05/02/2013   TRIG 63 05/02/2013   CHOLHDL 3.1 05/02/2013    A1C 5.4, Vitamin D 32 and she is not on a vitamin supplement GFR was 77 last visit.  Menses regular. LMP 04/17/14 Use to see dermatologist for eczema/dry skin, complains of it still.  She states that during the snow while walking her dog afterwards started to have left back pain with lateral radiation down thigh to her knee. Was getting better then started again with house cleaning yesterday. At work saw chiropractor on Thursday that helped. Took aleve that helped.   Current Medications:  None, aleve/tylenol occ  Health Maintenance:  Immunization History  Administered Date(s) Administered  . Tdap 05/01/2012   TDAP: 2014 Pneumovax: N/A Prevnar: NA Flu vaccine: declines Zostavax:N/A Pap: 2015 due 3 years MGM: DUE will give number DEXA: N/A Colonoscopy: this year- will send in referral EGD: N/A Derm Path 2014 Dentist: Dr. Loran Senters Eye: Dr. Satira Sark  Allergies: No Known  Allergies Medical History:  Past Medical History  Diagnosis Date  . Vitamin D deficiency   . Plantar fasciitis   . Grover's disease   . Seasonal affective disorder   . Arthritis     cervical neck, CT 2014   Surgical History:  Past Surgical History  Procedure Laterality Date  . Tonsillectomy and adenoidectomy    . Hernia repair      Umbilical  . Mandible surgery     Family History:  Family History  Problem Relation Age of Onset  . Hypertension Mother   . Peptic Ulcer Disease Father   . Cancer Father     prostate  . Asthma Sister   . Arthritis Sister    Social History:  History  Substance Use Topics  . Smoking status: Never Smoker   . Smokeless tobacco: Never Used  . Alcohol Use: No   Review of Systems  Constitutional: Positive for malaise/fatigue. Negative for fever, chills, weight loss and diaphoresis.  HENT: Negative.   Eyes: Negative.   Respiratory: Negative.   Cardiovascular: Negative.   Gastrointestinal: Negative.   Genitourinary: Negative.  Negative for dysuria, urgency, frequency, hematuria and flank pain.       + vaginal dryness  Musculoskeletal: Positive for back pain. Negative for myalgias, joint pain, falls and neck pain.  Skin: Negative.   Neurological: Negative.  Negative for tingling, sensory change, focal weakness and weakness.  Endo/Heme/Allergies: Negative.   Psychiatric/Behavioral: Positive for depression (with winter). Negative for suicidal ideas, hallucinations, memory loss (with pain from back) and substance abuse. The patient has insomnia. The patient is not  nervous/anxious.    Physical Exam: Estimated body mass index is 27.62 kg/(m^2) as calculated from the following:   Height as of this encounter: 5\' 5"  (1.651 m).   Weight as of this encounter: 166 lb (75.297 kg). Filed Vitals:   05/04/14 0901  BP: 110/72  Pulse: 72  Temp: 98.1 F (36.7 C)  Resp: 16   General Appearance: Well nourished, in no apparent distress. Eyes: PERRLA, EOMs,  conjunctiva no swelling or erythema, normal fundi and vessels. Sinuses: No Frontal/maxillary tenderness ENT/Mouth: Ext aud canals clear, normal light reflex with TMs without erythema, bulging.  Good dentition. No erythema, swelling, or exudate on post pharynx. Tonsils not swollen or erythematous. Hearing normal.  Neck: Supple, thyroid normal. No bruits Respiratory: Respiratory effort normal, BS equal bilaterally without rales, rhonchi, wheezing or stridor. Cardio: RRR without murmurs, rubs or gallops. Brisk peripheral pulses without edema.  Chest: symmetric, with normal excursions and percussion. Breasts: Symmetric, with some mobile, tender lumps bilateral breast, nipple discharge, retractions. Abdomen: Soft, +BS. + epigastric tenderness, no guarding, rebound, hernias, masses, or organomegaly. .  Lymphatics: Non tender without lymphadenopathy.  Genitourinary: declines Musculoskeletal: Full ROM all peripheral extremities,5/5 strength, and normal gait. Left hip + pain at IT pain, no bursitis tenderness, no SI tenderness, neg straight leg. Good distal nuerovascular exam.  Skin: Warm, dry without  lesions, ecchymosis.  Neuro: Cranial nerves intact, reflexes equal bilaterally. Normal muscle tone, no cerebellar symptoms. Sensation intact.  Psych: Awake and oriented X 3, normal affect, Insight and Judgment appropriate.   EKG: defer   Vicie Mutters 9:20 AM

## 2014-05-05 LAB — CBC WITH DIFFERENTIAL/PLATELET
BASOS ABS: 0 10*3/uL (ref 0.0–0.1)
BASOS PCT: 0 % (ref 0–1)
EOS ABS: 0.1 10*3/uL (ref 0.0–0.7)
Eosinophils Relative: 2 % (ref 0–5)
HEMATOCRIT: 44.5 % (ref 36.0–46.0)
Hemoglobin: 14.7 g/dL (ref 12.0–15.0)
Lymphocytes Relative: 23 % (ref 12–46)
Lymphs Abs: 1.5 10*3/uL (ref 0.7–4.0)
MCH: 30.9 pg (ref 26.0–34.0)
MCHC: 33 g/dL (ref 30.0–36.0)
MCV: 93.5 fL (ref 78.0–100.0)
MONOS PCT: 11 % (ref 3–12)
MPV: 9.5 fL (ref 8.6–12.4)
Monocytes Absolute: 0.7 10*3/uL (ref 0.1–1.0)
NEUTROS ABS: 4.3 10*3/uL (ref 1.7–7.7)
NEUTROS PCT: 64 % (ref 43–77)
PLATELETS: 271 10*3/uL (ref 150–400)
RBC: 4.76 MIL/uL (ref 3.87–5.11)
RDW: 13 % (ref 11.5–15.5)
WBC: 6.7 10*3/uL (ref 4.0–10.5)

## 2014-05-05 LAB — URINALYSIS, ROUTINE W REFLEX MICROSCOPIC
BILIRUBIN URINE: NEGATIVE
GLUCOSE, UA: NEGATIVE mg/dL
Hgb urine dipstick: NEGATIVE
Ketones, ur: NEGATIVE mg/dL
Leukocytes, UA: NEGATIVE
Nitrite: NEGATIVE
Protein, ur: NEGATIVE mg/dL
SPECIFIC GRAVITY, URINE: 1.025 (ref 1.005–1.030)
Urobilinogen, UA: 0.2 mg/dL (ref 0.0–1.0)
pH: 6 (ref 5.0–8.0)

## 2014-05-05 LAB — HEPATIC FUNCTION PANEL
ALK PHOS: 76 U/L (ref 39–117)
ALT: 167 U/L — ABNORMAL HIGH (ref 0–35)
AST: 72 U/L — AB (ref 0–37)
Albumin: 4.4 g/dL (ref 3.5–5.2)
BILIRUBIN TOTAL: 0.5 mg/dL (ref 0.2–1.2)
Bilirubin, Direct: 0.1 mg/dL (ref 0.0–0.3)
Indirect Bilirubin: 0.4 mg/dL (ref 0.2–1.2)
Total Protein: 7.5 g/dL (ref 6.0–8.3)

## 2014-05-05 LAB — LIPID PANEL
CHOL/HDL RATIO: 3.1 ratio
CHOLESTEROL: 185 mg/dL (ref 0–200)
HDL: 59 mg/dL (ref >=46)
LDL Cholesterol: 104 mg/dL — ABNORMAL HIGH (ref 0–99)
TRIGLYCERIDES: 108 mg/dL (ref <150)
VLDL: 22 mg/dL (ref 0–40)

## 2014-05-05 LAB — BASIC METABOLIC PANEL WITH GFR
BUN: 15 mg/dL (ref 6–23)
CO2: 22 mEq/L (ref 19–32)
CREATININE: 0.93 mg/dL (ref 0.50–1.10)
Calcium: 9.3 mg/dL (ref 8.4–10.5)
Chloride: 101 mEq/L (ref 96–112)
GFR, EST AFRICAN AMERICAN: 83 mL/min
GFR, EST NON AFRICAN AMERICAN: 72 mL/min
Glucose, Bld: 82 mg/dL (ref 70–99)
Potassium: 4.4 mEq/L (ref 3.5–5.3)
Sodium: 137 mEq/L (ref 135–145)

## 2014-05-05 LAB — VITAMIN D 25 HYDROXY (VIT D DEFICIENCY, FRACTURES): VIT D 25 HYDROXY: 18 ng/mL — AB (ref 30–100)

## 2014-05-11 ENCOUNTER — Telehealth: Payer: Self-pay | Admitting: *Deleted

## 2014-05-11 NOTE — Telephone Encounter (Signed)
Just a reminder pt is on tylenol & Nsaids for tooth everyday. Has had the root canal but still waiting on crown. Her dentist had wanted her to stay on the med? Also said if she comes in her levels will probably be the same Because of still being on meds? Please advise.

## 2014-05-12 ENCOUNTER — Encounter: Payer: Self-pay | Admitting: Physician Assistant

## 2014-09-26 IMAGING — CR DG CERVICAL SPINE COMPLETE 4+V
5 series · 5 of 5 positions shown · non-contrast
Comparison: None.

CLINICAL DATA: Left-sided neck pain

CERVICAL SPINE - COMPLETE 4+ VIEW

[view not recorded (1 of 5)]
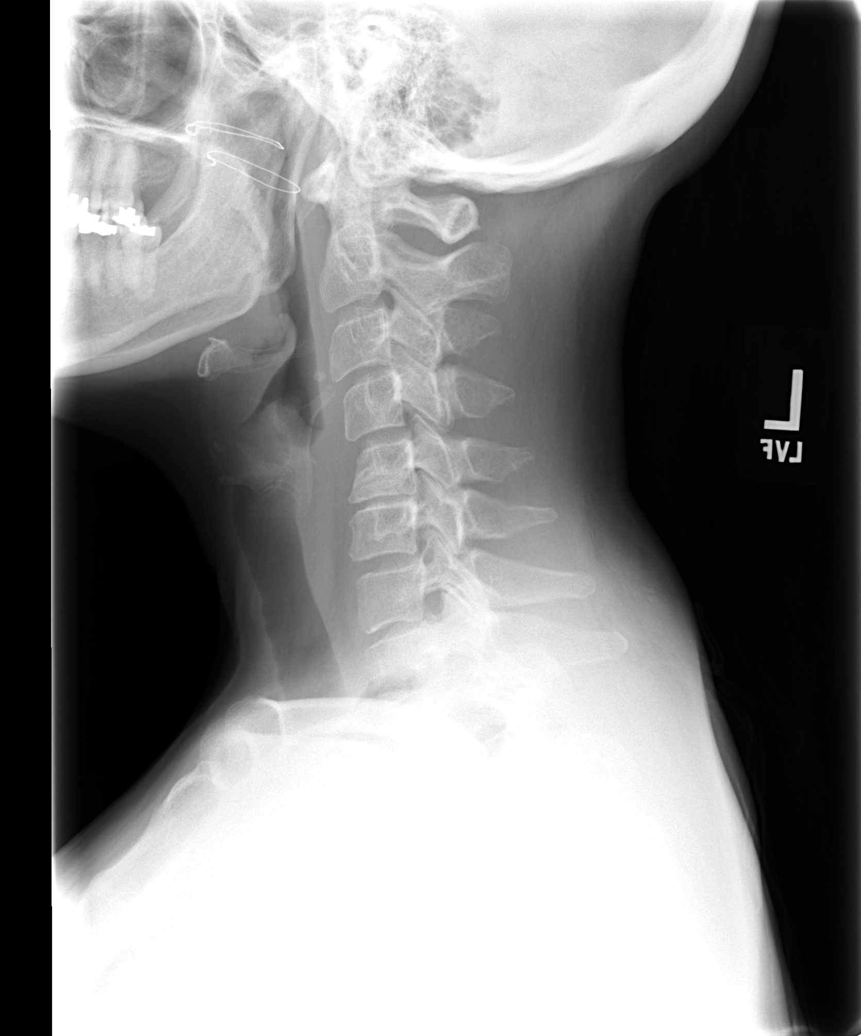

[view not recorded (2 of 5)]
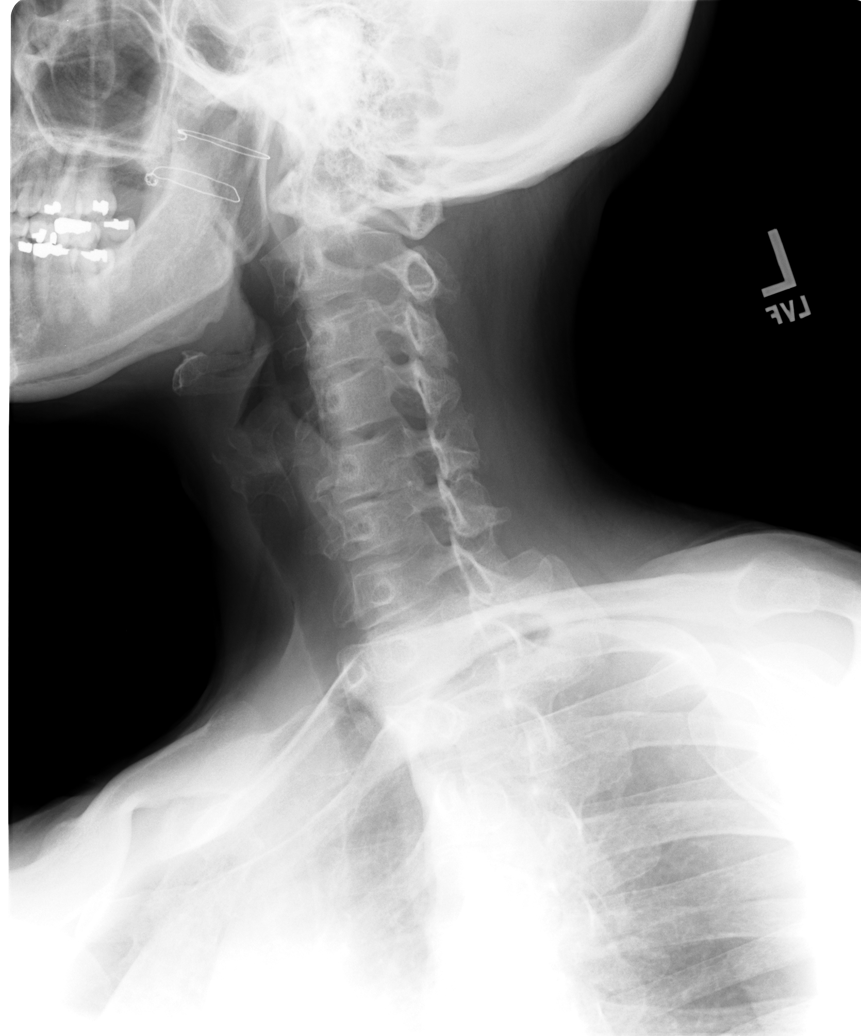

[view not recorded (3 of 5)]
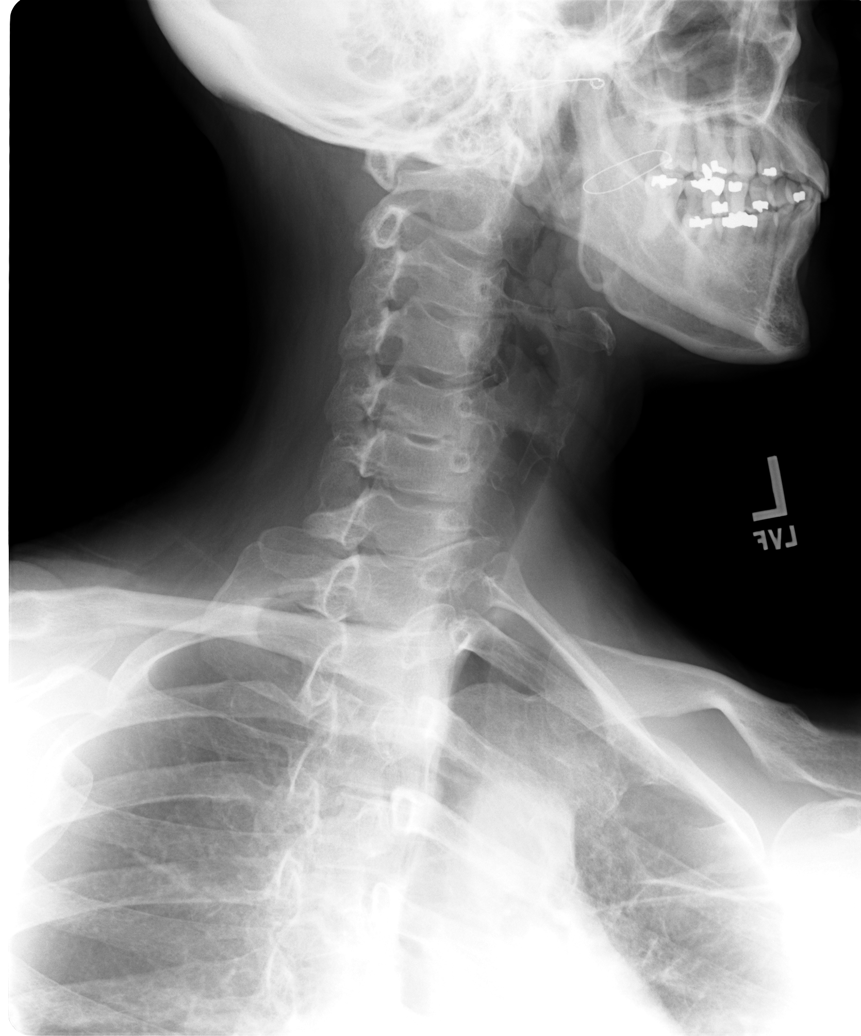

[view not recorded (4 of 5)]
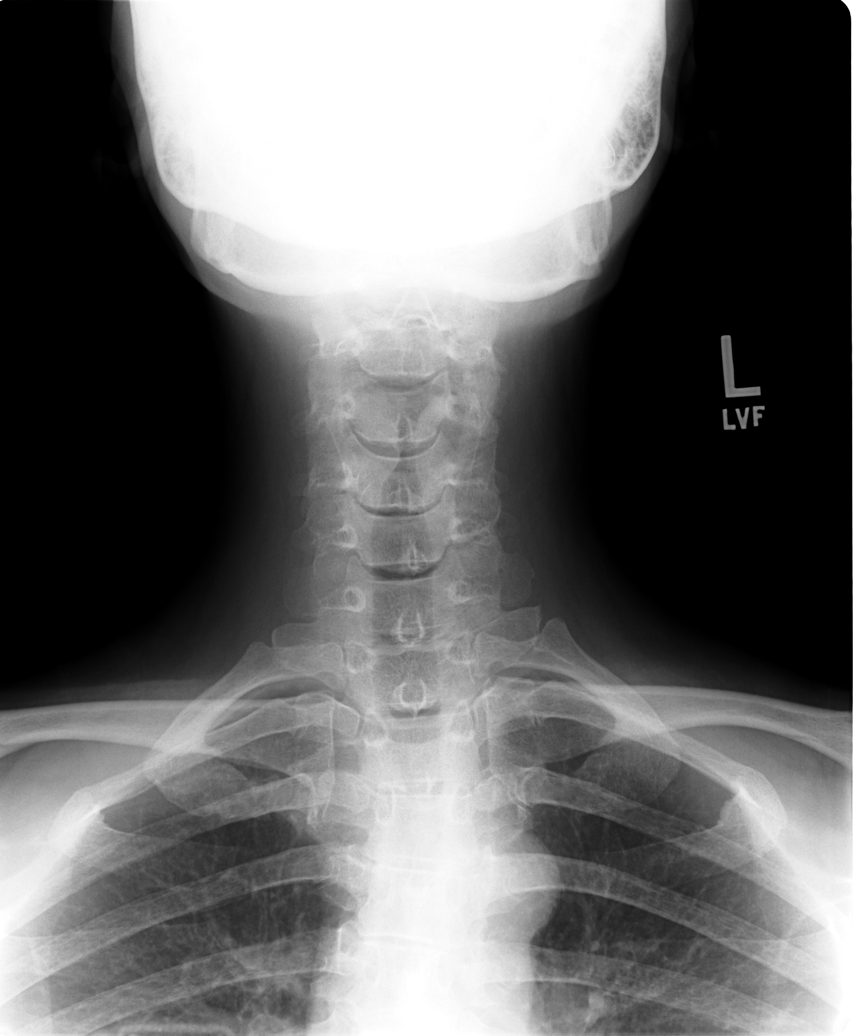

[view not recorded (5 of 5)]
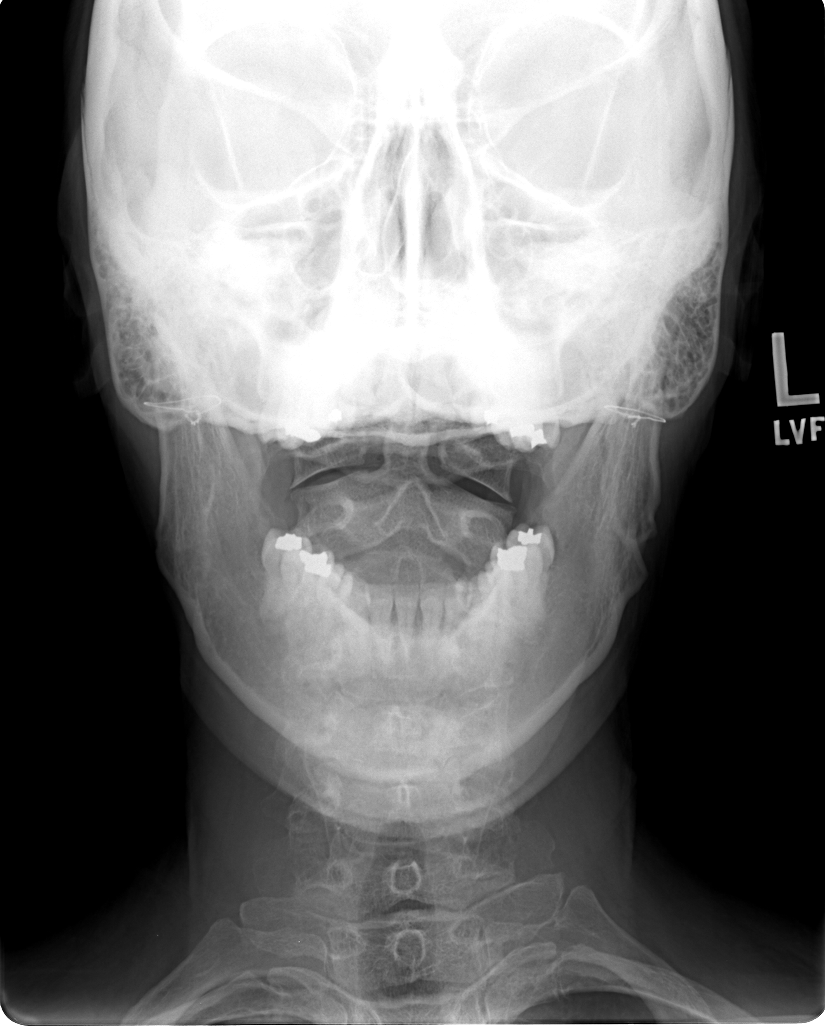

[5 of 5 positions shown; findings below may reference images not displayed]

FINDINGS: Frontal, lateral, open mouth odontoid, bilateral oblique
views were obtained.  There is no fracture or spondylolisthesis.
Prevertebral soft tissues and predental space regions are normal.

There is rather marked disc space narrowing at C5-6.  There is
slight disc space narrowing at C7-T1.  There is also modest disc
space narrowing at C3-4.  There is facet hypertrophy/osteoarthritic
change at C5-6 bilaterally.

There is mild reversal lordotic curvature.

There is previous wire fixation in the mandible regions.
IMPRESSION: No fracture or spondylolisthesis.  There is osteoarthritic change,
most marked at C5-6.

There is reversal lordotic curvature.  This finding probably
indicates a degree of muscle spasm.  If there is concern for
ligamentous injury, lateral flexion and extension views could be
helpful to further evaluate.

## 2014-10-22 ENCOUNTER — Other Ambulatory Visit: Payer: Self-pay

## 2014-10-22 DIAGNOSIS — Z1231 Encounter for screening mammogram for malignant neoplasm of breast: Secondary | ICD-10-CM

## 2014-10-29 ENCOUNTER — Ambulatory Visit
Admission: RE | Admit: 2014-10-29 | Discharge: 2014-10-29 | Disposition: A | Discharge status: Home / Self Care | Payer: BLUE CROSS/BLUE SHIELD | Source: Ambulatory Visit

## 2014-10-29 DIAGNOSIS — Z1231 Encounter for screening mammogram for malignant neoplasm of breast: Secondary | ICD-10-CM

## 2015-05-03 ENCOUNTER — Ambulatory Visit (INDEPENDENT_AMBULATORY_CARE_PROVIDER_SITE_OTHER): Payer: BLUE CROSS/BLUE SHIELD | Admitting: Physician Assistant

## 2015-05-03 ENCOUNTER — Encounter: Payer: Self-pay | Admitting: Physician Assistant

## 2015-05-03 VITALS — BP 108/60 | HR 66 | Temp 98.1°F | Resp 16 | Ht 66.5 in | Wt 168.8 lb

## 2015-05-03 DIAGNOSIS — Z1159 Encounter for screening for other viral diseases: Secondary | ICD-10-CM

## 2015-05-03 DIAGNOSIS — Z13220 Encounter for screening for lipoid disorders: Secondary | ICD-10-CM

## 2015-05-03 DIAGNOSIS — M1990 Unspecified osteoarthritis, unspecified site: Secondary | ICD-10-CM

## 2015-05-03 DIAGNOSIS — Z79899 Other long term (current) drug therapy: Secondary | ICD-10-CM | POA: Diagnosis not present

## 2015-05-03 DIAGNOSIS — Z1389 Encounter for screening for other disorder: Secondary | ICD-10-CM

## 2015-05-03 DIAGNOSIS — E559 Vitamin D deficiency, unspecified: Secondary | ICD-10-CM

## 2015-05-03 DIAGNOSIS — Z0000 Encounter for general adult medical examination without abnormal findings: Secondary | ICD-10-CM

## 2015-05-03 DIAGNOSIS — Z131 Encounter for screening for diabetes mellitus: Secondary | ICD-10-CM

## 2015-05-03 DIAGNOSIS — D649 Anemia, unspecified: Secondary | ICD-10-CM

## 2015-05-03 DIAGNOSIS — L111 Transient acantholytic dermatosis [Grover]: Secondary | ICD-10-CM

## 2015-05-03 DIAGNOSIS — F338 Other recurrent depressive disorders: Secondary | ICD-10-CM

## 2015-05-03 DIAGNOSIS — L719 Rosacea, unspecified: Secondary | ICD-10-CM

## 2015-05-03 DIAGNOSIS — Z1212 Encounter for screening for malignant neoplasm of rectum: Secondary | ICD-10-CM

## 2015-05-03 LAB — URINALYSIS, ROUTINE W REFLEX MICROSCOPIC
BILIRUBIN URINE: NEGATIVE
Glucose, UA: NEGATIVE
HGB URINE DIPSTICK: NEGATIVE
KETONES UR: NEGATIVE
Leukocytes, UA: NEGATIVE
Nitrite: NEGATIVE
PH: 5.5 (ref 5.0–8.0)
Protein, ur: NEGATIVE
SPECIFIC GRAVITY, URINE: 1.023 (ref 1.001–1.035)

## 2015-05-03 LAB — VITAMIN B12: VITAMIN B 12: 328 pg/mL (ref 200–1100)

## 2015-05-03 LAB — LIPID PANEL
Cholesterol: 173 mg/dL (ref 125–200)
HDL: 50 mg/dL (ref >=46)
LDL Cholesterol: 108 mg/dL (ref <130)
TRIGLYCERIDES: 74 mg/dL (ref <150)
Total CHOL/HDL Ratio: 3.5 Ratio (ref <=5.0)
VLDL: 15 mg/dL (ref <30)

## 2015-05-03 LAB — CBC WITH DIFFERENTIAL/PLATELET
BASOS PCT: 1 % (ref 0–1)
Basophils Absolute: 0.1 10*3/uL (ref 0.0–0.1)
EOS PCT: 3 % (ref 0–5)
Eosinophils Absolute: 0.2 10*3/uL (ref 0.0–0.7)
HEMATOCRIT: 41.3 % (ref 36.0–46.0)
HEMOGLOBIN: 14.1 g/dL (ref 12.0–15.0)
Lymphocytes Relative: 23 % (ref 12–46)
Lymphs Abs: 1.4 10*3/uL (ref 0.7–4.0)
MCH: 31 pg (ref 26.0–34.0)
MCHC: 34.1 g/dL (ref 30.0–36.0)
MCV: 90.8 fL (ref 78.0–100.0)
MONO ABS: 0.7 10*3/uL (ref 0.1–1.0)
MONOS PCT: 11 % (ref 3–12)
MPV: 9.5 fL (ref 8.6–12.4)
Neutro Abs: 3.9 10*3/uL (ref 1.7–7.7)
Neutrophils Relative %: 62 % (ref 43–77)
Platelets: 280 10*3/uL (ref 150–400)
RBC: 4.55 MIL/uL (ref 3.87–5.11)
RDW: 12.9 % (ref 11.5–15.5)
WBC: 6.3 10*3/uL (ref 4.0–10.5)

## 2015-05-03 LAB — BASIC METABOLIC PANEL WITH GFR
BUN: 15 mg/dL (ref 7–25)
CALCIUM: 9 mg/dL (ref 8.6–10.4)
CO2: 26 mmol/L (ref 20–31)
Chloride: 104 mmol/L (ref 98–110)
Creat: 0.86 mg/dL (ref 0.50–1.05)
GFR, EST NON AFRICAN AMERICAN: 78 mL/min (ref >=60)
GLUCOSE: 86 mg/dL (ref 65–99)
POTASSIUM: 4.3 mmol/L (ref 3.5–5.3)
Sodium: 138 mmol/L (ref 135–146)

## 2015-05-03 LAB — HIV ANTIBODY (ROUTINE TESTING W REFLEX): HIV: NONREACTIVE

## 2015-05-03 LAB — IRON AND TIBC
%SAT: 33 % (ref 11–50)
Iron: 90 ug/dL (ref 45–160)
TIBC: 270 ug/dL (ref 250–450)
UIBC: 180 ug/dL (ref 125–400)

## 2015-05-03 LAB — HEPATIC FUNCTION PANEL
ALBUMIN: 4.2 g/dL (ref 3.6–5.1)
ALT: 22 U/L (ref 6–29)
AST: 15 U/L (ref 10–35)
Alkaline Phosphatase: 53 U/L (ref 33–130)
BILIRUBIN TOTAL: 0.5 mg/dL (ref 0.2–1.2)
Bilirubin, Direct: 0.1 mg/dL (ref <=0.2)
Indirect Bilirubin: 0.4 mg/dL (ref 0.2–1.2)
TOTAL PROTEIN: 6.7 g/dL (ref 6.1–8.1)

## 2015-05-03 LAB — MICROALBUMIN / CREATININE URINE RATIO
Creatinine, Urine: 166 mg/dL (ref 20–320)
Microalb Creat Ratio: 1 mcg/mg creat (ref <30)
Microalb, Ur: 0.2 mg/dL

## 2015-05-03 LAB — FERRITIN: FERRITIN: 64 ng/mL (ref 10–232)

## 2015-05-03 LAB — MAGNESIUM: MAGNESIUM: 2 mg/dL (ref 1.5–2.5)

## 2015-05-03 LAB — TSH: TSH: 1.7 m[IU]/L

## 2015-05-03 LAB — HEPATITIS C ANTIBODY: HCV Ab: NEGATIVE

## 2015-05-03 NOTE — Progress Notes (Signed)
Complete Physical  Assessment and Plan: 1. 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 Seasonal affective disorder ([place]) Add vitamin D, exercise, follow up if needed  4. Rosacea Follows with derm  5. Arthritis - TSH  6. Routine general medical examination at a health care facility - CBC with Differential/Platelet - BASIC METABOLIC PANEL WITH GFR - Hepatic function panel - TSH - Lipid panel - Hemoglobin A1c - Magnesium - VITAMIN D 25 Hydroxy (Vit-D Deficiency, Fractures) - Urinalysis, Routine w reflex microscopic (not at [facility]) - Microalbumin / creatinine urine ratio - Iron and TIBC - Ferritin - Vitamin B12 - POC Hemoccult Bld/Stl (3-Cd Home Screen); Future - HIV antibody - Hepatitis C antibody  7. Screening cholesterol level - Lipid panel  8. Screening for blood or protein in urine - Urinalysis, Routine w reflex microscopic (not at [facility]) - Microalbumin / creatinine urine ratio  9. Screening for diabetes mellitus - Hemoglobin A1c  10. Medication management - CBC with Differential/Platelet - BASIC METABOLIC PANEL WITH GFR - Hepatic function panel - Magnesium  11. Screening for viral disease - HIV antibody - Hepatitis C antibody  12. Anemia, unspecified anemia type - Iron and TIBC - Ferritin - Vitamin B12  13. Screening for rectal cancer - POC Hemoccult Bld/Stl (3-Cd Home Screen); Future She declined a colonoscopy in spite of being explained the risks and benefits of the colonoscopy in detail, including cancer and death. Willing to do hemoccult but understands that this is test not as sensitive or specific as a colonoscopy and you are still recommended to get a colonoscopy.   Discussed med's effects and SE's. Screening labs and tests as requested with regular follow-up as recommended. HPI 50 y.o. female  presents for a complete physical. Her blood pressure has been controlled at home, today  their BP is BP: 108/60 mmHg She does not workout, will walk her lab. She denies chest pain, shortness of breath, dizziness.   Her cholesterol is at goal. The cholesterol last visit was:  Lab Results  Component Value Date   CHOL 185 05/04/2014   HDL 59 05/04/2014   LDLCALC 104* 05/04/2014   TRIG 108 05/04/2014   CHOLHDL 3.1 05/04/2014     Lab Results  Component Value Date   HGBA1C 5.4 05/02/2013   She has Vitamin D def, she on a vitamin supplement sporadically.  Lab Results  Component Value Date   VD25OH 18* 05/04/2014   Lab Results  Component Value Date   GFRNONAA 72 05/04/2014   Menses regular. LMP 04/17/14 Use to see dermatologist for eczema/dry skin, complains of it still.     Current Medications:    Medication List       This list is accurate as of: 05/03/15  9:19 AM.  Always use your most recent med list.               acetaminophen 500 MG tablet  Commonly known as:  TYLENOL  Take 500 mg by mouth every 6 (six) hours as needed.     naproxen sodium 220 MG tablet  Commonly known as:  ANAPROX  Take 220 mg by mouth as needed.        Health Maintenance:  Immunization History  Administered Date(s) Administered  . Tdap 05/01/2012   TDAP: 2014 Pneumovax: N/A Prevnar: NA Flu vaccine: declines Zostavax:N/A  Pap: 2015 due 3 years, never abnormal pap MGM: 10/29/2014 DEXA: N/A Colonoscopy:  Never got done,  declines at this time, long discussion about risk of colon cancer, will do hemoccult and disucss next year EGD: N/A Derm Path 2014  Dentist: Dr. Loran Senters Eye: Dr. Satira Sark  Allergies: No Known Allergies Medical History:  Past Medical History  Diagnosis Date  . Vitamin D deficiency   . Plantar fasciitis   . Grover's disease   . Seasonal affective disorder ([place])   . Arthritis     cervical neck, CT 2014  . Rosacea 01/01/2009    Qualifier: Diagnosis of  By: Jerold Coombe     Surgical History:  Past Surgical History  Procedure Laterality  Date  . Tonsillectomy and adenoidectomy    . Hernia repair      Umbilical  . Mandible surgery     Family History:  Family History  Problem Relation Age of Onset  . Hypertension Mother   . Peptic Ulcer Disease Father   . Cancer Father     prostate  . Asthma Sister   . Arthritis Sister    Social History:  Social History  Substance Use Topics  . Smoking status: Never Smoker   . Smokeless tobacco: Never Used  . Alcohol Use: No   Review of Systems  Constitutional: Positive for malaise/fatigue. Negative for fever, chills, weight loss and diaphoresis.  HENT: Negative.   Eyes: Negative.   Respiratory: Negative.   Cardiovascular: Negative.   Gastrointestinal: Negative.   Genitourinary: Negative.  Negative for dysuria, urgency, frequency, hematuria and flank pain.       + vaginal dryness  Musculoskeletal: Negative for myalgias, back pain, joint pain, falls and neck pain.  Skin: Negative.   Neurological: Negative.  Negative for tingling, sensory change, focal weakness and weakness.  Endo/Heme/Allergies: Negative.   Psychiatric/Behavioral: Positive for depression (with winter). Negative for suicidal ideas, hallucinations, memory loss and substance abuse. The patient is not nervous/anxious and does not have insomnia.    Physical Exam: Estimated body mass index is 26.84 kg/(m^2) as calculated from the following:   Height as of this encounter: 5' 6.5" (1.689 m).   Weight as of this encounter: 168 lb 12.8 oz (76.567 kg). Filed Vitals:   05/03/15 0911  BP: 108/60  Pulse: 66  Temp: 98.1 F (36.7 C)  Resp: 16   General Appearance: Well nourished, in no apparent distress. Eyes: PERRLA, EOMs, conjunctiva no swelling or erythema, normal fundi and vessels. Sinuses: No Frontal/maxillary tenderness ENT/Mouth: Ext aud canals clear, normal light reflex with TMs without erythema, bulging.  Good dentition. No erythema, swelling, or exudate on post pharynx. Tonsils not swollen or erythematous.  Hearing normal.  Neck: Supple, thyroid normal. No bruits Respiratory: Respiratory effort normal, BS equal bilaterally without rales, rhonchi, wheezing or stridor. Cardio: RRR without murmurs, rubs or gallops. Brisk peripheral pulses without edema.  Chest: symmetric, with normal excursions and percussion. Breasts: Symmetric, with some mobile, tender lumps bilateral breast, nipple discharge, retractions. Abdomen: Soft, +BS. + epigastric tenderness, no guarding, rebound, hernias, masses, or organomegaly.  Lymphatics: Non tender without lymphadenopathy.  Genitourinary: declines Musculoskeletal: Full ROM all peripheral extremities,5/5 strength, and normal gait.  Skin: Warm, dry without  lesions, ecchymosis.  Neuro: Cranial nerves intact, reflexes equal bilaterally. Normal muscle tone, no cerebellar symptoms. Sensation intact.  Psych: Awake and oriented X 3, normal affect, Insight and Judgment appropriate.   EKG: defer, will get next year with PAP   Vicie Mutters 9:19 AM

## 2015-05-03 NOTE — Patient Instructions (Addendum)
Vitamin D goal is between 60-80  Please make sure that you are taking your Vitamin D as directed.   It is very important as a natural anti-inflammatory   helping hair, skin, and nails, as well as reducing stroke and heart attack risk.   It helps your bones and helps with mood.  It also decreases numerous cancer risks so please take it as directed.   Low Vit D is associated with a 200-300% higher risk for CANCER   and 200-300% higher risk for HEART   ATTACK  &  STROKE.    .....................................Marland Kitchen  It is also associated with higher death rate at younger ages,   autoimmune diseases like Rheumatoid arthritis, Lupus, Multiple Sclerosis.     Also many other serious conditions, like depression, Alzheimer's  Dementia, infertility, muscle aches, fatigue, fibromyalgia - just to name a few.  +++++++++++++++++++   Add ENTERIC COATED low dose 81 mg Aspirin daily OR can do every other day if you have easy bruising to protect your heart and head. As well as to reduce risk of Colon Cancer by 20 %, Skin Cancer by 26 % , Melanoma by 46% and Pancreatic cancer by 60%  Colon cancer is 3rd most diagnosed cancer and 2nd leading cause of death in both men and women 52 years of age and older despite being one of the most preventable and treatable cancers if found early. Please do the hemoccult card but understand that the colonoscopy is the best screening tool and I do suggest getting it.    Use a dropper or use a cap to put olive oil,mineral oil or canola oil in the effected ear- 2-3 times a week. Let it soak for 20-30 min then you can take a shower or use a baby bulb with warm water to wash out the ear wax.  Do not use Qtips  VAGINAL DRYNESS OVERVIEW  Vaginal dryness, also known as atrophic vaginitis, is a common condition in postmenopausal women. This condition is also common in women who have had both ovaries removed at the time of hysterectomy.   Some women have uncomfortable  symptoms of vaginal dryness, such as pain with sex, burning vaginal discomfort or itching, or abnormal vaginal discharge, while others have no symptoms at all.  VAGINAL DRYNESS CAUSES   Estrogen helps to keep the vagina moist and to maintain thickness of the vaginal lining. Vaginal dryness occurs when the ovaries produce a decreased amount of estrogen. This can occur at certain times in a woman's life, and may be permanent or temporary. Times when less estrogen is made include: ?At the time of menopause. ?After surgical removal of the ovaries, chemotherapy, or radiation therapy of the pelvis for cancer. ?After having a baby, particularly in women who breastfeed. ?While using certain medications, such as danazol, medroxyprogesterone (brand names: Provera or DepoProvera), leuprolide (brand name: Lupron), or nafarelin. When these medications are stopped, estrogen production resumes.  Women who smoke cigarettes have been shown to have an increased risk of an earlier menopause transition as compared to non-smokers. Therefore, atrophic vaginitis symptoms may appear at a younger age in this population.  VAGINAL DRYNESS TREATMENT   There are three treatment options for women with vaginal dryness:  Vaginal lubricants and moisturizers - Vaginal lubricants and moisturizers can be purchased without a prescription. These products do not contain any hormones and have virtually no side effects. - Albolene is found in the facial cleanser section at [employer], [facility], or [employer]. It is a large jar  with a blue top. This is the best lubricant for women because it is hypoallergenic. -Natural lubricants, such as olive, avocado or peanut oil, are easily available products that may be used as a lubricant with sex.  -Vaginal moisturizes (eg, Replens, Moist Again, Vagisil, K-Y Silk-E, and Feminease) are formulated to allow water to be retained in the vaginal tissues. Moisturizers are applied into the vagina three times weekly  to allow a continued moisturizing effect. These should not be used just before having sex, as they can be irritating.  Vaginal estrogen - Vaginal estrogen is the most effective treatment option for women with vaginal dryness. Vaginal estrogen must be prescribed by a healthcare provider. Very low doses of vaginal estrogen can be used when it is put into the vagina to treat vaginal dryness. A small amount of estrogen is absorbed into the bloodstream, but only about 100 times less than when using estrogen pills or tablets. As a result, there is a much lower risk of side effects, such as blood clots, breast cancer, and heart attack, compared with other estrogen-containing products (birth control pills, menopausal hormone therapy).   Ospemifene - Ospemifene is a prescription medication that is similar to estrogen, but is not estrogen. In the vaginal tissue, it acts similarly to estrogen. In the breast tissue, it acts as an estrogen blocker. It comes in a pill, and is prescribed for women who want to use an estrogen-like medication for vaginal dryness or painful sex associated with vaginal dryness, but prefer not to use a vaginal medication. The medication may cause hot flashes as a side effect. This type of medication may increase the risk of blood clots or uterine cancer. Further study of ospemifene is needed to evaluate the risk of these complications. This medication has not been tested in women who have had breast cancer or are at a high risk of developing breast cancer.    Sexual activity - Vaginal estrogen improves vaginal dryness quickly, usually within a few weeks. You may continue to have sex as you treat vaginal dryness because sex itself can help to keep the vaginal tissues healthy. Vaginal intercourse may help the vaginal tissues by keeping them soft and stretchable and preventing the tissues from shrinking.  If sex continues to be painful despite treatment for vaginal dryness, talk to your  healthcare provider.

## 2015-05-04 LAB — HEMOGLOBIN A1C
HEMOGLOBIN A1C: 5.2 % (ref <5.7)
MEAN PLASMA GLUCOSE: 103 mg/dL (ref <117)

## 2015-05-04 LAB — VITAMIN D 25 HYDROXY (VIT D DEFICIENCY, FRACTURES): Vit D, 25-Hydroxy: 22 ng/mL — ABNORMAL LOW (ref 30–100)

## 2015-08-25 DIAGNOSIS — H6123 Impacted cerumen, bilateral: Secondary | ICD-10-CM | POA: Diagnosis not present

## 2015-08-27 DIAGNOSIS — H40012 Open angle with borderline findings, low risk, left eye: Secondary | ICD-10-CM | POA: Diagnosis not present

## 2015-08-27 DIAGNOSIS — H40011 Open angle with borderline findings, low risk, right eye: Secondary | ICD-10-CM | POA: Diagnosis not present

## 2015-08-27 DIAGNOSIS — H5213 Myopia, bilateral: Secondary | ICD-10-CM | POA: Diagnosis not present

## 2015-12-07 DIAGNOSIS — H6123 Impacted cerumen, bilateral: Secondary | ICD-10-CM | POA: Diagnosis not present

## 2016-03-21 DIAGNOSIS — H6123 Impacted cerumen, bilateral: Secondary | ICD-10-CM | POA: Diagnosis not present

## 2016-05-04 ENCOUNTER — Encounter: Payer: Self-pay | Admitting: Physician Assistant

## 2016-05-04 ENCOUNTER — Ambulatory Visit (INDEPENDENT_AMBULATORY_CARE_PROVIDER_SITE_OTHER): Payer: BLUE CROSS/BLUE SHIELD | Admitting: Physician Assistant

## 2016-05-04 VITALS — BP 118/72 | HR 76 | Temp 97.7°F | Ht 66.5 in | Wt 163.6 lb

## 2016-05-04 DIAGNOSIS — Z79899 Other long term (current) drug therapy: Secondary | ICD-10-CM | POA: Diagnosis not present

## 2016-05-04 DIAGNOSIS — Z1389 Encounter for screening for other disorder: Secondary | ICD-10-CM

## 2016-05-04 DIAGNOSIS — Z0000 Encounter for general adult medical examination without abnormal findings: Secondary | ICD-10-CM | POA: Diagnosis not present

## 2016-05-04 DIAGNOSIS — E559 Vitamin D deficiency, unspecified: Secondary | ICD-10-CM

## 2016-05-04 DIAGNOSIS — F338 Other recurrent depressive disorders: Secondary | ICD-10-CM

## 2016-05-04 DIAGNOSIS — Z1212 Encounter for screening for malignant neoplasm of rectum: Secondary | ICD-10-CM

## 2016-05-04 DIAGNOSIS — M1990 Unspecified osteoarthritis, unspecified site: Secondary | ICD-10-CM

## 2016-05-04 DIAGNOSIS — Z13220 Encounter for screening for lipoid disorders: Secondary | ICD-10-CM

## 2016-05-04 DIAGNOSIS — L719 Rosacea, unspecified: Secondary | ICD-10-CM

## 2016-05-04 DIAGNOSIS — L111 Transient acantholytic dermatosis [Grover]: Secondary | ICD-10-CM

## 2016-05-04 DIAGNOSIS — Z130 Encounter for screening for diseases of the blood and blood-forming organs and certain disorders involving the immune mechanism: Secondary | ICD-10-CM

## 2016-05-04 LAB — URINALYSIS, ROUTINE W REFLEX MICROSCOPIC
Bilirubin Urine: NEGATIVE
GLUCOSE, UA: NEGATIVE
HGB URINE DIPSTICK: NEGATIVE
Ketones, ur: NEGATIVE
LEUKOCYTES UA: NEGATIVE
NITRITE: NEGATIVE
PH: 6.5 (ref 5.0–8.0)
Protein, ur: NEGATIVE
SPECIFIC GRAVITY, URINE: 1.017 (ref 1.001–1.035)

## 2016-05-04 LAB — CBC WITH DIFFERENTIAL/PLATELET
BASOS ABS: 0 {cells}/uL (ref 0–200)
Basophils Relative: 0 %
EOS PCT: 2 %
Eosinophils Absolute: 116 cells/uL (ref 15–500)
HEMATOCRIT: 42 % (ref 35.0–45.0)
HEMOGLOBIN: 14.2 g/dL (ref 11.7–15.5)
LYMPHS ABS: 812 {cells}/uL — AB (ref 850–3900)
Lymphocytes Relative: 14 %
MCH: 31.6 pg (ref 27.0–33.0)
MCHC: 33.8 g/dL (ref 32.0–36.0)
MCV: 93.3 fL (ref 80.0–100.0)
MPV: 9.8 fL (ref 7.5–12.5)
Monocytes Absolute: 1334 cells/uL — ABNORMAL HIGH (ref 200–950)
Monocytes Relative: 23 %
NEUTROS PCT: 61 %
Neutro Abs: 3538 cells/uL (ref 1500–7800)
Platelets: 242 10*3/uL (ref 140–400)
RBC: 4.5 MIL/uL (ref 3.80–5.10)
RDW: 13.6 % (ref 11.0–15.0)
WBC: 5.8 10*3/uL (ref 3.8–10.8)

## 2016-05-04 LAB — HEPATIC FUNCTION PANEL
ALK PHOS: 53 U/L (ref 33–130)
ALT: 26 U/L (ref 6–29)
AST: 16 U/L (ref 10–35)
Albumin: 4.1 g/dL (ref 3.6–5.1)
BILIRUBIN DIRECT: 0.1 mg/dL (ref <=0.2)
BILIRUBIN TOTAL: 0.5 mg/dL (ref 0.2–1.2)
Indirect Bilirubin: 0.4 mg/dL (ref 0.2–1.2)
Total Protein: 6.8 g/dL (ref 6.1–8.1)

## 2016-05-04 LAB — IRON AND TIBC
%SAT: 29 % (ref 11–50)
Iron: 86 ug/dL (ref 45–160)
TIBC: 295 ug/dL (ref 250–450)
UIBC: 209 ug/dL (ref 125–400)

## 2016-05-04 LAB — BASIC METABOLIC PANEL WITH GFR
BUN: 10 mg/dL (ref 7–25)
CHLORIDE: 104 mmol/L (ref 98–110)
CO2: 25 mmol/L (ref 20–31)
Calcium: 8.9 mg/dL (ref 8.6–10.4)
Creat: 0.93 mg/dL (ref 0.50–1.05)
GFR, EST AFRICAN AMERICAN: 82 mL/min (ref >=60)
GFR, Est Non African American: 71 mL/min (ref >=60)
Glucose, Bld: 78 mg/dL (ref 65–99)
POTASSIUM: 4.3 mmol/L (ref 3.5–5.3)
SODIUM: 138 mmol/L (ref 135–146)

## 2016-05-04 LAB — VITAMIN B12: Vitamin B-12: 331 pg/mL (ref 200–1100)

## 2016-05-04 LAB — LIPID PANEL
CHOL/HDL RATIO: 2.9 ratio (ref <5.0)
Cholesterol: 164 mg/dL (ref <200)
HDL: 57 mg/dL (ref >50)
LDL CALC: 86 mg/dL (ref <100)
TRIGLYCERIDES: 104 mg/dL (ref <150)
VLDL: 21 mg/dL (ref <30)

## 2016-05-04 LAB — TSH: TSH: 1.65 m[IU]/L

## 2016-05-04 LAB — MAGNESIUM: MAGNESIUM: 2 mg/dL (ref 1.5–2.5)

## 2016-05-04 LAB — FERRITIN: Ferritin: 48 ng/mL (ref 10–232)

## 2016-05-04 NOTE — Progress Notes (Signed)
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  Seasonal affective disorder ([facility]) Add vitamin D 5000 IU, exercise, follow up if needed   Rosacea Follows with derm  Arthritis - TSH  Routine general medical examination at a health care facility Declines EKG  Screening cholesterol level - Lipid panel  Screening for blood or protein in urine - Urinalysis, Routine w reflex microscopic (not at [facility]) - Microalbumin / creatinine urine ratio  Medication management - CBC with Differential/Platelet - BASIC METABOLIC PANEL WITH GFR - Hepatic function panel - Magnesium  Anemia, unspecified anemia type, screen - Iron and TIBC - Ferritin - Vitamin B12  Screening for rectal cancer Will get colonoscopy  Left shoulder pain Likely rotator cuff tendonitis versus partial tear, likely from her lab pulling her.  Exercises given, do naproxen, if not better will refer to ortho  Discussed med's effects and SE's. Screening labs and tests as requested with regular follow-up as recommended.  HPI 51 y.o. female  presents for a complete physical. Her blood pressure has been controlled at home, today their BP is BP: 118/72 She does workout, will walk her lab and working out with a trainer. She denies chest pain, shortness of breath, dizziness. She does have fatigue, has traveled a lot for work, sleeping okay but not as well as she use to.   Her cholesterol is at goal. The cholesterol last visit was:  Lab Results  Component Value Date   CHOL 173 05/03/2015   HDL 50 05/03/2015   LDLCALC 108 05/03/2015   TRIG 74 05/03/2015   CHOLHDL 3.5 05/03/2015     Lab Results  Component Value Date   HGBA1C 5.2 05/03/2015   She has Vitamin D def, she on a vitamin supplement sporadically.  Lab Results  Component Value Date   VD25OH 22 (L) 05/03/2015   Lab Results  Component Value Date    GFRNONAA 78 05/03/2015   Use to see dermatologist for eczema/dry skin, complains of it still.   Right shoulder pain x 6 months, possible injury with larger dog/lab pulling her on the leash,   Current Medications:  Allergies as of 05/04/2016   No Known Allergies     Medication List       Accurate as of 05/04/16  9:37 AM. Always use your most recent med list.          acetaminophen 500 MG tablet Commonly known as:  TYLENOL Take 500 mg by mouth every 6 (six) hours as needed.   naproxen sodium 220 MG tablet Commonly known as:  ANAPROX Take 220 mg by mouth as needed.       Health Maintenance:  Immunization History  Administered Date(s) Administered  . Tdap 05/01/2012   TDAP: 2014 Pneumovax: N/A Prevnar: NA Flu vaccine: declines Zostavax:N/A  Pap: 2015 due 5 years, never abnormal pap MGM: 10/29/2014 DEXA: N/A Colonoscopy:  Never got done, declines at this time, long discussion about risk of colon cancer, will do hemoccult and disucss next year EGD: N/A Derm Path 2014  Dentist: Dr. Loran Senters Eye: Dr. Satira Sark  Medical History:  Past Medical History:  Diagnosis Date  . Arthritis    cervical neck, CT 2014  . Grover's disease   . Plantar fasciitis   . Rosacea 01/01/2009   Qualifier: Diagnosis of  By: Jerold Coombe    . Seasonal affective disorder ([place])   .  Vitamin D deficiency   Allergies No Known Allergies  SURGICAL HISTORY She  has a past surgical history that includes Tonsillectomy and adenoidectomy; Hernia repair; and Mandible surgery. FAMILY HISTORY Her family history includes Arthritis in her sister; Asthma in her sister; Cancer in her father; Hypertension in her mother; Peptic Ulcer Disease in her father. SOCIAL HISTORY She  reports that she has never smoked. She has never used smokeless tobacco. She reports that she does not drink alcohol or use drugs.   Review of Systems  Constitutional: Positive for malaise/fatigue. Negative for chills,  diaphoresis, fever and weight loss.  HENT: Negative.   Eyes: Negative.   Respiratory: Negative.   Cardiovascular: Negative.   Gastrointestinal: Negative.   Genitourinary: Negative.  Negative for dysuria, flank pain, frequency, hematuria and urgency.       + vaginal dryness  Musculoskeletal: Positive for joint pain. Negative for back pain, falls, myalgias and neck pain.  Skin: Negative.   Neurological: Negative.  Negative for tingling, sensory change, focal weakness and weakness.  Endo/Heme/Allergies: Negative.   Psychiatric/Behavioral: Positive for depression (with winter). Negative for hallucinations, memory loss, substance abuse and suicidal ideas. The patient is not nervous/anxious and does not have insomnia.    Physical Exam: Estimated body mass index is 26.01 kg/m as calculated from the following:   Height as of this encounter: 5' 6.5" (1.689 m).   Weight as of this encounter: 163 lb 9.6 oz (74.2 kg). Vitals:   05/04/16 0853  BP: 118/72  Pulse: 76  Temp: 97.7 F (36.5 C)   General Appearance: Well nourished, in no apparent distress. Eyes: PERRLA, EOMs, conjunctiva no swelling or erythema, normal fundi and vessels. Sinuses: No Frontal/maxillary tenderness ENT/Mouth: Ext aud canals clear, normal light reflex with TMs without erythema, bulging.  Good dentition. No erythema, swelling, or exudate on post pharynx. Tonsils not swollen or erythematous. Hearing normal.  Neck: Supple, thyroid normal. No bruits Respiratory: Respiratory effort normal, BS equal bilaterally without rales, rhonchi, wheezing or stridor. Cardio: RRR without murmurs, rubs or gallops. Brisk peripheral pulses without edema.  Chest: symmetric, with normal excursions and percussion. Breasts: Symmetric, with some mobile, tender lumps bilateral breast, nipple discharge, retractions. Abdomen: Soft, +BS. + epigastric tenderness, no guarding, rebound, hernias, masses, or organomegaly.  Lymphatics: Non tender without  lymphadenopathy.  Genitourinary: declines Musculoskeletal: Full ROM all peripheral extremities,5/5 strength all except right shoulder with decreased abduction, + empty can test, + pain with internal and external rotation, and normal gait.  Skin: Warm, dry without  lesions, ecchymosis.  Neuro: Cranial nerves intact, reflexes equal bilaterally. Normal muscle tone, no cerebellar symptoms. Sensation intact.  Psych: Awake and oriented X 3, normal affect, Insight and Judgment appropriate.   EKG: declines   Vicie Mutters 9:37 AM

## 2016-05-04 NOTE — Patient Instructions (Addendum)
Vitamin D goal is between 60-80  Please make sure that you are taking your Vitamin D as directed.   It is very important as a natural anti-inflammatory   helping hair, skin, and nails, as well as reducing stroke and heart attack risk.   It helps your bones and helps with mood.  We want you on at least 5000 IU daily  It also decreases numerous cancer risks so please take it as directed.   Low Vit D is associated with a 200-300% higher risk for CANCER   and 200-300% higher risk for HEART   ATTACK  &  STROKE.    .....................................Marland Kitchen  It is also associated with higher death rate at younger ages,   autoimmune diseases like Rheumatoid arthritis, Lupus, Multiple Sclerosis.     Also many other serious conditions, like depression, Alzheimer's  Dementia, infertility, muscle aches, fatigue, fibromyalgia - just to name a few.  +++++++++++++++++++  Can get liquid vitamin D from [place] here in [place] at  [facility] alternatives [address] [place], [place] [address] Or you can try earth fare      Rotator Cuff Tendinitis Rotator cuff tendinitis is inflammation of the tough, cord-like bands that connect muscle to bone (tendons) in the rotator cuff. The rotator cuff includes all of the muscles and tendons that connect the arm to the shoulder. The rotator cuff holds the head of the upper arm bone (humerus) in the cup (fossa) of the shoulder blade (scapula). This condition can lead to a long-lasting (chronic) tear. The tear may be partial or complete. What are the causes? This condition is usually caused by overusing the rotator cuff. What increases the risk? This condition is more likely to develop in athletes and workers who frequently use their shoulder or reach over their heads. This can include activities such as:  Tennis.  Baseball or softball.  Swimming.  Construction work.  Painting. What are the signs or symptoms? Symptoms of this condition  include:  Pain spreading (radiating) from the shoulder to the upper arm.  Swelling and tenderness in front of the shoulder.  Pain when reaching, pulling, or lifting the arm above the head.  Pain when lowering the arm from above the head.  Minor pain in the shoulder when resting.  Increased pain in the shoulder at night.  Difficulty placing the arm behind the back. How is this diagnosed? This condition is diagnosed with a medical history and physical exam. Tests may also be done, including:  X-rays.  MRI.  Ultrasounds.  CT or MR arthrogram. During this test, a contrast material is injected and then images are taken. How is this treated? Treatment for this condition depends on the severity of the condition. In less severe cases, treatment may include:  Rest. This may be done with a sling that holds the shoulder still (immobilization). Your health care provider may also recommend avoiding activities that involve lifting your arm over your head.  Icing the shoulder.  Anti-inflammatory medicines, such as aspirin or ibuprofen. In more severe cases, treatment may include:  Physical therapy.  Steroid injections.  Surgery. Follow these instructions at home: If you have a sling:   Wear the sling as told by your health care provider. Remove it only as told by your health care provider.  Loosen the sling if your fingers tingle, become numb, or turn cold and blue.  Keep the sling clean.  If the sling is not waterproof, do not let it get wet. Remove it,  if allowed, or cover it with a watertight covering when you take a bath or shower. Managing pain, stiffness, and swelling   If directed, put ice on the injured area.  If you have a removable sling, remove it as told by your health care provider.  Put ice in a plastic bag.  Place a towel between your skin and the bag.  Leave the ice on for 20 minutes, 2-3 times a day.  Move your fingers often to avoid stiffness and to  lessen swelling.  Raise (elevate) the injured area above the level of your heart while you are lying down.  Find a comfortable sleeping position or sleep on a recliner, if available. Driving   Do not drive or use heavy machinery while taking prescription pain medicine.  Ask your health care provider when it is safe to drive if you have a sling on your arm. Activity   Rest your shoulder as told by your health care provider.  Return to your normal activities as told by your health care provider. Ask your health care provider what activities are safe for you.  Do any exercises or stretches as told by your health care provider.  If you do repetitive overhead tasks, take small breaks in between and include stretching exercises as told by your health care provider. General instructions   Do not use any products that contain nicotine or tobacco, such as cigarettes and e-cigarettes. These can delay healing. If you need help quitting, ask your health care provider.  Take over-the-counter and prescription medicines only as told by your health care provider.  Keep all follow-up visits as told by your health care provider. This is important. Contact a health care provider if:  Your pain gets worse.  You have new pain in your arm, hands, or fingers.  Your pain is not relieved with medicine or does not get better after 6 weeks of treatment.  You have cracking sensations when moving your shoulder in certain directions.  You hear a snapping sound after using your shoulder, followed by severe pain and weakness. Get help right away if:  Your arm, hand, or fingers are numb or tingling.  Your arm, hand, or fingers are swollen or painful or they turn white or blue. Summary  Rotator cuff tendinitis is inflammation of the tough, cord-like bands that connect muscle to bone (tendons) in the rotator cuff.  This condition is usually caused by overusing the rotator cuff, which includes all of the  muscles and tendons that connect the arm to the shoulder.  This condition is more likely to develop in athletes and workers who frequently use their shoulder or reach over their heads.  Treatment generally includes rest, anti-inflammatory medicines, and icing. In some cases, physical therapy and steroid injections may be needed. In severe cases, surgery may be needed. This information is not intended to replace advice given to you by your health care provider. Make sure you discuss any questions you have with your health care provider. Document Released: 05/13/2003 Document Revised: 02/07/2016 Document Reviewed: 02/07/2016 Elsevier Interactive Patient Education  2017 [place].   Shoulder Impingement Syndrome Rehab Ask your health care provider which exercises are safe for you. Do exercises exactly as told by your health care provider and adjust them as directed. It is normal to feel mild stretching, pulling, tightness, or discomfort as you do these exercises, but you should stop right away if you feel sudden pain or your pain gets worse.Do not begin these exercises  until told by your health care provider. Stretching and range of motion exercise This exercise warms up your muscles and joints and improves the movement and flexibility of your shoulder. This exercise also helps to relieve pain and stiffness. Exercise A: Passive horizontal adduction   1. Sit or stand and pull your left / right elbow across your chest, toward your other shoulder. Stop when you feel a gentle stretch in the back of your shoulder and upper arm.  Keep your arm at shoulder height.  Keep your arm as close to your body as you comfortably can. 2. Hold for __________ seconds. 3. Slowly return to the starting position. Repeat __________ times. Complete this exercise __________ times a day. Strengthening exercises These exercises build strength and endurance in your shoulder. Endurance is the ability to use your  muscles for a long time, even after they get tired. Exercise B: External rotation, isometric  1. Stand or sit in a doorway, facing the door frame. 2. Bend your left / right elbow and place the back of your wrist against the door frame. Only your wrist should be touching the frame. Keep your upper arm at your side. 3. Gently press your wrist against the door frame, as if you are trying to push your arm away from your abdomen.  Avoid shrugging your shoulder while you press your hand against the door frame. Keep your shoulder blade tucked down toward the middle of your back. 4. Hold for __________ seconds. 5. Slowly release the tension, and relax your muscles completely before you do the exercise again. Repeat __________ times. Complete this exercise __________ times a day. Exercise C: Internal rotation, isometric   1. Stand or sit in a doorway, facing the door frame. 2. Bend your left / right elbow and place the inside of your wrist against the door frame. Only your wrist should be touching the frame. Keep your upper arm at your side. 3. Gently press your wrist against the door frame, as if you are trying to push your arm toward your abdomen.  Avoid shrugging your shoulder while you press your hand against the door frame. Keep your shoulder blade tucked down toward the middle of your back. 4. Hold for __________ seconds. 5. Slowly release the tension, and relax your muscles completely before you do the exercise again. Repeat __________ times. Complete this exercise __________ times a day. Exercise D: Scapular protraction, supine   1. Lie on your back on a firm surface. Hold a __________ weight in your left / right hand. 2. Raise your left / right arm straight into the air so your hand is directly above your shoulder joint. 3. Push the weight into the air so your shoulder lifts off of the surface that you are lying on. Do not move your head, neck, or back. 4. Hold for __________  seconds. 5. Slowly return to the starting position. Let your muscles relax completely before you repeat this exercise. Repeat __________ times. Complete this exercise __________ times a day. Exercise E: Scapular retraction   1. Sit in a stable chair without armrests, or stand. 2. Secure an exercise band to a stable object in front of you so the band is at shoulder height. 3. Hold one end of the exercise band in each hand. Your palms should face down. 4. Squeeze your shoulder blades together and move your elbows slightly behind you. Do not shrug your shoulders while you do this. 5. Hold for __________ seconds. 6. Slowly return to the  starting position. Repeat __________ times. Complete this exercise __________ times a day. Exercise F: Shoulder extension   1. Sit in a stable chair without armrests, or stand. 2. Secure an exercise band to a stable object in front of you where the band is above shoulder height. 3. Hold one end of the exercise band in each hand. 4. Straighten your elbows and lift your hands up to shoulder height. 5. Squeeze your shoulder blades together and pull your hands down to the sides of your thighs. Stop when your hands are straight down by your sides. Do not let your hands go behind your body. 6. Hold for __________ seconds. 7. Slowly return to the starting position. Repeat __________ times. Complete this exercise __________ times a day. This information is not intended to replace advice given to you by your health care provider. Make sure you discuss any questions you have with your health care provider. Document Released: 02/20/2005 Document Revised: 10/28/2015 Document Reviewed: 01/23/2015 Elsevier Interactive Patient Education  2017 [employer].

## 2016-05-05 LAB — MICROALBUMIN / CREATININE URINE RATIO
Creatinine, Urine: 122 mg/dL (ref 20–320)
MICROALB UR: 0.2 mg/dL
MICROALB/CREAT RATIO: 2 ug/mg{creat} (ref <30)

## 2016-05-05 LAB — VITAMIN D 25 HYDROXY (VIT D DEFICIENCY, FRACTURES): Vit D, 25-Hydroxy: 24 ng/mL — ABNORMAL LOW (ref 30–100)

## 2016-05-09 ENCOUNTER — Telehealth: Payer: Self-pay

## 2016-05-09 NOTE — Telephone Encounter (Signed)
PT CALLED TO INFORM OUR OFFICE THAT SHE DID NOT WANT TO COMPLETE THE COLOGUARD.  PT WANTS A COLONOSCOPY.

## 2016-05-11 ENCOUNTER — Encounter: Payer: Self-pay | Admitting: Gastroenterology

## 2016-06-28 ENCOUNTER — Ambulatory Visit (AMBULATORY_SURGERY_CENTER): Payer: Self-pay

## 2016-06-28 VITALS — Ht 65.0 in | Wt 158.6 lb

## 2016-06-28 DIAGNOSIS — Z1211 Encounter for screening for malignant neoplasm of colon: Secondary | ICD-10-CM

## 2016-06-28 MED ORDER — SUPREP BOWEL PREP KIT 17.5-3.13-1.6 GM/177ML PO SOLN: 1.0000 | Freq: Once | ORAL | 0 refills | Status: AC

## 2016-06-28 NOTE — Progress Notes (Signed)
No allergies to eggs or soy No diet meds No home oxygen No past problems with anesthesia  Registered for emmi 

## 2016-06-30 ENCOUNTER — Encounter: Payer: Self-pay | Admitting: Gastroenterology

## 2016-07-12 ENCOUNTER — Encounter: Payer: Self-pay | Admitting: Gastroenterology

## 2016-07-12 ENCOUNTER — Ambulatory Visit (AMBULATORY_SURGERY_CENTER): Payer: BLUE CROSS/BLUE SHIELD | Admitting: Gastroenterology

## 2016-07-12 VITALS — BP 102/69 | HR 64 | Temp 98.4°F | Resp 17 | Ht 65.0 in | Wt 158.0 lb

## 2016-07-12 DIAGNOSIS — Z1211 Encounter for screening for malignant neoplasm of colon: Secondary | ICD-10-CM

## 2016-07-12 DIAGNOSIS — Z1212 Encounter for screening for malignant neoplasm of rectum: Secondary | ICD-10-CM

## 2016-07-12 MED ORDER — SODIUM CHLORIDE 0.9 % IV SOLN: 500.0000 mL | INTRAVENOUS | Status: DC

## 2016-07-12 NOTE — Progress Notes (Signed)
To recovery, report to Jones, RN, VSS 

## 2016-07-12 NOTE — Op Note (Signed)
[facility] Patient Name: Wanda Wilson Procedure Date: 07/12/2016 10:38 AM MRN: [id number] Endoscopist: Mauri Pole , MD Age: 51 Referring MD:  Date of Birth: 1966-02-12 Gender: Female Account #: [id number] Procedure:                Colonoscopy Indications:              Screening for colorectal malignant neoplasm, This                            is the patient's first colonoscopy Medicines:                Monitored Anesthesia Care Procedure:                Pre-Anesthesia Assessment:                           - Prior to the procedure, a History and Physical                            was performed, and patient medications and                            allergies were reviewed. The patient's tolerance of                            previous anesthesia was also reviewed. The risks                            and benefits of the procedure and the sedation                            options and risks were discussed with the patient.                            All questions were answered, and informed consent                            was obtained. Prior Anticoagulants: The patient has                            taken no previous anticoagulant or antiplatelet                            agents. ASA Grade Assessment: II - A patient with                            mild systemic disease. After reviewing the risks                            and benefits, the patient was deemed in                            satisfactory condition to undergo the procedure.  After obtaining informed consent, the colonoscope                            was passed under direct vision. Throughout the                            procedure, the patient's blood pressure, pulse, and                            oxygen saturations were monitored continuously. The                            Model CF-HQ190L [id number]) scope was introduced                            through the anus and  advanced to the the cecum,                            identified by appendiceal orifice and ileocecal                            valve. The colonoscopy was performed without                            difficulty. The patient tolerated the procedure                            well. The quality of the bowel preparation was                            excellent. The ileocecal valve, appendiceal                            orifice, and rectum were photographed. Scope In: 10:50:19 AM Scope Out: 11:06:27 AM Scope Withdrawal Time: 0 hours 6 minutes 43 seconds  Total Procedure Duration: 0 hours 16 minutes 8 seconds  Findings:                 The perianal and digital rectal examinations were                            normal.                           Non-bleeding internal hemorrhoids were found during                            retroflexion. The hemorrhoids were small.                           The exam was otherwise without abnormality. Complications:            No immediate complications. Estimated Blood Loss:     Estimated blood loss: none. Impression:               - Non-bleeding internal hemorrhoids.                           -  The examination was otherwise normal.                           - No specimens collected. Recommendation:           - Patient has a contact number available for                            emergencies. The signs and symptoms of potential                            delayed complications were discussed with the                            patient. Return to normal activities tomorrow.                            Written discharge instructions were provided to the                            patient.                           - Resume previous diet.                           - Continue present medications.                           - Await pathology results.                           - Repeat colonoscopy in 10 years for screening                             purposes. Mauri Pole, MD 07/12/2016 11:10:37 AM This report has been signed electronically.

## 2016-07-12 NOTE — Patient Instructions (Signed)
YOU HAD AN ENDOSCOPIC PROCEDURE TODAY AT [facility]:   Refer to the procedure report that was given to you for any specific questions about what was found during the examination.  If the procedure report does not answer your questions, please call your gastroenterologist to clarify.  If you requested that your care partner not be given the details of your procedure findings, then the procedure report has been included in a sealed envelope for you to review at your convenience later.  YOU SHOULD EXPECT: Some feelings of bloating in the abdomen. Passage of more gas than usual.  Walking can help get rid of the air that was put into your GI tract during the procedure and reduce the bloating. If you had a lower endoscopy (such as a colonoscopy or flexible sigmoidoscopy) you may notice spotting of blood in your stool or on the toilet paper. If you underwent a bowel prep for your procedure, you may not have a normal bowel movement for a few days.  Please Note:  You might notice some irritation and congestion in your nose or some drainage.  This is from the oxygen used during your procedure.  There is no need for concern and it should clear up in a day or so.  SYMPTOMS TO REPORT IMMEDIATELY:   Following lower endoscopy (colonoscopy or flexible sigmoidoscopy):  Excessive amounts of blood in the stool  Significant tenderness or worsening of abdominal pains  Swelling of the abdomen that is new, acute  Fever of 100F or higher   Following upper endoscopy (EGD)  Vomiting of blood or coffee ground material  New chest pain or pain under the shoulder blades  Painful or persistently difficult swallowing  New shortness of breath  Fever of 100F or higher  Black, tarry-looking stools  For urgent or emergent issues, a gastroenterologist can be reached at any hour by calling [phone].   DIET:  We do recommend a small meal at first, but then you may proceed to your regular diet.  Drink  plenty of fluids but you should avoid alcoholic beverages for 24 hours.  ACTIVITY:  You should plan to take it easy for the rest of today and you should NOT DRIVE or use heavy machinery until tomorrow (because of the sedation medicines used during the test).    FOLLOW UP: Our staff will call the number listed on your records the next business day following your procedure to check on you and address any questions or concerns that you may have regarding the information given to you following your procedure. If we do not reach you, we will leave a message.  However, if you are feeling well and you are not experiencing any problems, there is no need to return our call.  We will assume that you have returned to your regular daily activities without incident.  If any biopsies were taken you will be contacted by phone or by letter within the next 1-3 weeks.  Please call us at [phone] if you have not heard about the biopsies in 3 weeks.   Hemorrhoids (handout given) Repeat Colonoscopy screening in 10 years High Fiber Diet (handout given)   SIGNATURES/CONFIDENTIALITY: You and/or your care partner have signed paperwork which will be entered into your electronic medical record.  These signatures attest to the fact that that the information above on your After Visit Summary has been reviewed and is understood.  Full responsibility of the confidentiality of this discharge information lies with you  and/or your care-partner. 

## 2016-07-13 ENCOUNTER — Telehealth: Payer: Self-pay | Admitting: *Deleted

## 2016-07-13 NOTE — Telephone Encounter (Signed)
  Follow up Call-  Call back number 07/12/2016  Post procedure Call Back phone  # [phone]  Permission to leave phone message Yes  Some recent data might be hidden     Patient questions:  Do you have a fever, pain , or abdominal swelling? No. Pain Score  0 *  Have you tolerated food without any problems? Yes.    Have you been able to return to your normal activities? Yes.    Do you have any questions about your discharge instructions: Diet   No. Medications  No. Follow up visit  No.  Do you have questions or concerns about your Care? No.  Actions: * If pain score is 4 or above: No action needed, pain <4.

## 2016-07-18 DIAGNOSIS — H6123 Impacted cerumen, bilateral: Secondary | ICD-10-CM | POA: Diagnosis not present

## 2016-09-01 DIAGNOSIS — H40013 Open angle with borderline findings, low risk, bilateral: Secondary | ICD-10-CM | POA: Diagnosis not present

## 2016-09-01 DIAGNOSIS — H524 Presbyopia: Secondary | ICD-10-CM | POA: Diagnosis not present

## 2016-10-24 ENCOUNTER — Ambulatory Visit (INDEPENDENT_AMBULATORY_CARE_PROVIDER_SITE_OTHER): Payer: BLUE CROSS/BLUE SHIELD | Admitting: Internal Medicine

## 2016-10-24 VITALS — BP 96/60 | HR 76 | Temp 97.4°F | Resp 16 | Ht 65.0 in | Wt 152.9 lb

## 2016-10-24 DIAGNOSIS — L03316 Cellulitis of umbilicus: Secondary | ICD-10-CM

## 2016-10-24 MED ORDER — DOXYCYCLINE HYCLATE 100 MG PO CAPS: ORAL_CAPSULE | ORAL | 0 refills | Status: DC

## 2016-10-24 NOTE — Progress Notes (Signed)
  Subjective:    Patient ID: Wanda Wilson, female    DOB: August 30, 1965, 51 y.o.   MRN: [id number]  HPI  This nice 51 yo single WF relates remote hx/o umbilical hernia rpr in 2330 and over the last 5-7 days has developed a yellowish drainage and irritation. She has tried cleaning the Umbilical area with Q7M2. Denies fever, chills rash otherwise.   Medication Sig  . acetaminophen (TYLENOL) 500 MG tablet Take 500 mg by mouth every 6 (six) hours as needed.  . naproxen sodium (ANAPROX) 220 MG tablet Take 220 mg by mouth as needed.   Allergies  Allergen Reactions  . Apple     Lip swelling; pt believes it's chemicals on peel   Past Medical History:  Diagnosis Date  . Arthritis    cervical neck, CT 2014  . Grover's disease   . Plantar fasciitis   . Rosacea 01/01/2009   Qualifier: Diagnosis of  By: Jerold Coombe    . 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 Vitamin D deficiency    Review of Systems  10 point systems review negative except as above.    Objective:   Physical Exam  BP 96/60   Pulse 76   Temp (!) 97.4 F (36.3 C)   Resp 16   Ht 5\' 5"  (1.651 m)   Wt 152 lb 14.4 oz (69.4 kg)   BMI 25.44 kg/m   Abdomen is soft and inward umbilical stoma is probed with cotton swabs noting a yellowish serous drainage. No palpable induration is nored.     Assessment & Plan:   1. Cellulitis of umbilicus  - doxycycline (VIBRAMYCIN) 100 MG capsule; Take 1 capsule 2 x/day with food for 5 days -  then 1 x/day with food for 10 days  Dispense: 20 capsule; Refill: 0  - Advised to call in 5-7 days if sx's not improved or sooner if sx's worsen.

## 2016-11-14 DIAGNOSIS — H6123 Impacted cerumen, bilateral: Secondary | ICD-10-CM | POA: Diagnosis not present

## 2017-01-18 DIAGNOSIS — L821 Other seborrheic keratosis: Secondary | ICD-10-CM | POA: Diagnosis not present

## 2017-01-18 DIAGNOSIS — L853 Xerosis cutis: Secondary | ICD-10-CM | POA: Diagnosis not present

## 2017-01-18 DIAGNOSIS — D225 Melanocytic nevi of trunk: Secondary | ICD-10-CM | POA: Diagnosis not present

## 2017-01-18 DIAGNOSIS — L918 Other hypertrophic disorders of the skin: Secondary | ICD-10-CM | POA: Diagnosis not present

## 2017-03-13 DIAGNOSIS — H6123 Impacted cerumen, bilateral: Secondary | ICD-10-CM | POA: Diagnosis not present

## 2017-05-08 ENCOUNTER — Encounter: Payer: Self-pay | Admitting: Physician Assistant

## 2017-05-08 ENCOUNTER — Ambulatory Visit (INDEPENDENT_AMBULATORY_CARE_PROVIDER_SITE_OTHER): Payer: BLUE CROSS/BLUE SHIELD | Admitting: Physician Assistant

## 2017-05-08 VITALS — BP 96/58 | HR 71 | Temp 97.9°F | Ht 65.0 in | Wt 161.0 lb

## 2017-05-08 DIAGNOSIS — F338 Other recurrent depressive disorders: Secondary | ICD-10-CM

## 2017-05-08 DIAGNOSIS — Z0000 Encounter for general adult medical examination without abnormal findings: Secondary | ICD-10-CM | POA: Diagnosis not present

## 2017-05-08 DIAGNOSIS — E559 Vitamin D deficiency, unspecified: Secondary | ICD-10-CM | POA: Diagnosis not present

## 2017-05-08 DIAGNOSIS — Z79899 Other long term (current) drug therapy: Secondary | ICD-10-CM

## 2017-05-08 DIAGNOSIS — Z1389 Encounter for screening for other disorder: Secondary | ICD-10-CM | POA: Diagnosis not present

## 2017-05-08 DIAGNOSIS — L719 Rosacea, unspecified: Secondary | ICD-10-CM

## 2017-05-08 DIAGNOSIS — M1990 Unspecified osteoarthritis, unspecified site: Secondary | ICD-10-CM

## 2017-05-08 DIAGNOSIS — Z13220 Encounter for screening for lipoid disorders: Secondary | ICD-10-CM

## 2017-05-08 DIAGNOSIS — Z136 Encounter for screening for cardiovascular disorders: Secondary | ICD-10-CM

## 2017-05-08 DIAGNOSIS — L111 Transient acantholytic dermatosis [Grover]: Secondary | ICD-10-CM

## 2017-05-08 NOTE — Progress Notes (Signed)
Pt done not want to complete COLOGUARD at this time.

## 2017-05-08 NOTE — Progress Notes (Signed)
Patient ID: Wanda Wilson, female   DOB: 1965-08-21, 52 y.o.   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  Seasonal affective disorder (HCC) Continue exercise, declines wellbutrin but did discuss it, wants to check vitamin D in summer   Rosacea Follows with derm  Arthritis Continue exercise  Routine general medical examination at a health care facility 1 year REMINDED TO GET MGM  Screening cholesterol level - Lipid panel  Screening for blood or protein in urine - Urinalysis, Routine w reflex microscopic (not at [facility]) - Microalbumin / creatinine urine ratio  Medication management - CBC with Differential/Platelet - BASIC METABOLIC PANEL WITH GFR - Hepatic function panel - Magnesium Analayah was seen today for annual exam.  Vitamin D deficiency -     VITAMIN D 25 Hydroxy (Vit-D Deficiency, Fractures); Future - check in august/sept  Screening for cardiovascular condition -     EKG 12-Lead  Discussed med's effects and SE's. Screening labs and tests as requested with regular follow-up as recommended.  HPI 52 y.o. female  presents for a complete physical. Her blood pressure has been controlled at home, today their BP is BP: (!) 96/58  BP Readings from Last 3 Encounters:  05/08/17 (!) 96/58  10/24/16 96/60  07/12/16 102/69   She does workout, will walk her lab and working out with a trainer. She denies chest pain, shortness of breath, dizziness.  BF had MI at 50 may 2018.   Her cholesterol is at goal. The cholesterol last visit was:  Lab Results  Component Value Date   CHOL 164 05/04/2016   HDL 57 05/04/2016   LDLCALC 86 05/04/2016   TRIG 104 05/04/2016   CHOLHDL 2.9 05/04/2016     Lab Results  Component Value Date   HGBA1C 5.2 05/03/2015   She has Vitamin D def, she on a vitamin supplement sporadically.  Lab Results  Component Value  Date   VD25OH 24 (L) 05/04/2016   Lab Results  Component Value Date   GFRNONAA 71 05/04/2016   Use to see dermatologist for eczema/dry skin, complains of it still.   BMI is Body mass index is 26.79 kg/m., she is working on diet and exercise. Wt Readings from Last 3 Encounters:  05/08/17 161 lb (73 kg)  10/24/16 152 lb 14.4 oz (69.4 kg)  07/12/16 158 lb (71.7 kg)    Current Medications:  Current Outpatient Medications on File Prior to Visit  Medication Sig  . acetaminophen (TYLENOL) 500 MG tablet Take 500 mg by mouth every 6 (six) hours as needed.  . doxycycline (VIBRAMYCIN) 100 MG capsule Take 1 capsule 2 x/day with food for 5 days -  then 1 x/day with food for 10 days  . naproxen sodium (ANAPROX) 220 MG tablet Take 220 mg by mouth as needed.   No current facility-administered medications on file prior to visit.     Health Maintenance:  Immunization History  Administered Date(s) Administered  . Tdap 05/01/2012   TDAP: 2014 Pneumovax: N/A Prevnar: NA Flu vaccine: declines Zostavax:N/A  Pap: 2015 due 5 years, never abnormal pap MGM: 10/29/2014 DEXA: N/A Colonoscopy:  07/12/2016 EGD: N/A Derm Path 2014  Dentist: Dr. Loran Senters Eye: Dr. Satira Sark  Medical History:  Past Medical History:  Diagnosis Date  . Arthritis    cervical neck, CT 2014  . Grover's disease   . Plantar fasciitis   .  Rosacea 01/01/2009   Qualifier: Diagnosis of  By: Jerold Coombe    . Seasonal affective disorder ([place])   . Vitamin D deficiency    Allergies No Active Allergies  SURGICAL HISTORY She  has a past surgical history that includes Tonsillectomy and adenoidectomy; Hernia repair; and Mandible surgery. FAMILY HISTORY Her family history includes Arthritis in her sister; Asthma in her sister; Cancer in her father; Hypertension in her mother; Peptic Ulcer Disease in her father. SOCIAL HISTORY She  reports that  has never smoked. she has never used smokeless tobacco. She reports  that she drinks alcohol. She reports that she does not use drugs.   Review of Systems  Constitutional: Negative for chills, diaphoresis, fever, malaise/fatigue and weight loss.  HENT: Negative.   Eyes: Negative.   Respiratory: Negative.   Cardiovascular: Negative.   Gastrointestinal: Negative.   Genitourinary: Negative.  Negative for dysuria, flank pain, frequency, hematuria and urgency.       + vaginal dryness  Musculoskeletal: Negative for back pain, falls, joint pain, myalgias and neck pain.  Skin: Negative.   Neurological: Negative.  Negative for tingling, sensory change, focal weakness and weakness.  Endo/Heme/Allergies: Negative.   Psychiatric/Behavioral: Positive for depression (with winter). Negative for hallucinations, memory loss, substance abuse and suicidal ideas. The patient is not nervous/anxious and does not have insomnia.    Physical Exam: Estimated body mass index is 26.79 kg/m as calculated from the following:   Height as of this encounter: 5\' 5"  (1.651 m).   Weight as of this encounter: 161 lb (73 kg). Vitals:   05/08/17 0903  BP: (!) 96/58  Pulse: 71  Temp: 97.9 F (36.6 C)  SpO2: 97%   General Appearance: Well nourished, in no apparent distress. Eyes: PERRLA, EOMs, conjunctiva no swelling or erythema, normal fundi and vessels. Sinuses: No Frontal/maxillary tenderness ENT/Mouth: Ext aud canals clear, normal light reflex with TMs without erythema, bulging.  Good dentition. No erythema, swelling, or exudate on post pharynx. Tonsils not swollen or erythematous. Hearing normal.  Neck: Supple, thyroid normal. No bruits Respiratory: Respiratory effort normal, BS equal bilaterally without rales, rhonchi, wheezing or stridor. Cardio: RRR without murmurs, rubs or gallops. Brisk peripheral pulses without edema.  Chest: symmetric, with normal excursions and percussion. Breasts: Symmetric, with some mobile, tender lumps bilateral breast, nipple discharge,  retractions. Abdomen: Soft, +BS. + epigastric tenderness, no guarding, rebound, hernias, masses, or organomegaly.  Lymphatics: Non tender without lymphadenopathy.  Genitourinary: declines Musculoskeletal: Full ROM all peripheral extremities,5/5 strength  and normal gait.  Skin: Warm, dry without  lesions, ecchymosis.  Neuro: Cranial nerves intact, reflexes equal bilaterally. Normal muscle tone, no cerebellar symptoms. Sensation intact.  Psych: Awake and oriented X 3, normal affect, Insight and Judgment appropriate.   EKG: declines   Vicie Mutters 9:14 AM

## 2017-05-08 NOTE — Patient Instructions (Addendum)
[facility]  7 a.m.-6:30 p.m., Monday 7 a.m.-5 p.m., Tuesday-Friday Schedule an appointment by calling [phone].  [facility] Schedule an appointment by calling [phone].  Use a dropper or use a cap to put peroxide, olive oil,mineral oil or canola oil in the effected ear- 2-3 times a week. Let it soak for 20-30 min then you can take a shower or use a baby bulb with warm water to wash out the ear wax.  Do not use Qtips  Here is some information to help you keep your heart healthy: Move it! - Aim for 30 mins of activity every day. Take it slowly at first. Talk to [place] before starting any new exercise program.   Lose it.  -Body Mass Index (BMI) can indicate if you need to lose weight. A healthy range is 18.5-24.9. For a BMI calculator, go to [employer]  Waist Management -Excess abdominal fat is a risk factor for heart disease, diabetes, asthma, stroke and more. Ideal waist circumference is less than 35" for women and less than 40" for men.   Eat Right -focus on fruits, vegetables, whole grains, and meals you make yourself. Avoid foods with trans fat and high sugar/sodium content.   Snooze or Snore? - Loud snoring can be a sign of sleep apnea, a significant risk factor for high blood pressure, heart attach, stroke, and heart arrhythmias.  Kick the habit -Quit Smoking! Avoid second hand smoke. A single cigarette raises your blood pressure for 20 mins and increases the risk of heart attack and stroke for the next 24 hours.   Are Aspirin and Supplements right for you? -Add ENTERIC COATED low dose 81 mg Aspirin daily OR can do every other day if you have easy bruising to protect your heart and head. As well as to reduce risk of Colon Cancer by 20 %, Skin Cancer by 26 % , Melanoma by 46% and Pancreatic cancer by 60%  Say "No to Stress -There may be little you can do about problems that cause stress. However, techniques such as long walks,  meditation, and exercise can help you manage it.   Start Now! - Make changes one at a time and set reasonable goals to increase your likelihood of success.

## 2017-05-09 LAB — HEPATIC FUNCTION PANEL
AG Ratio: 1.9 (calc) (ref 1.0–2.5)
ALBUMIN MSPROF: 4.6 g/dL (ref 3.6–5.1)
ALT: 18 U/L (ref 6–29)
AST: 13 U/L (ref 10–35)
Alkaline phosphatase (APISO): 55 U/L (ref 33–130)
BILIRUBIN TOTAL: 0.6 mg/dL (ref 0.2–1.2)
Bilirubin, Direct: 0.1 mg/dL (ref 0.0–0.2)
Globulin: 2.4 g/dL (calc) (ref 1.9–3.7)
Indirect Bilirubin: 0.5 mg/dL (calc) (ref 0.2–1.2)
TOTAL PROTEIN: 7 g/dL (ref 6.1–8.1)

## 2017-05-09 LAB — URINALYSIS, ROUTINE W REFLEX MICROSCOPIC
Bilirubin Urine: NEGATIVE
GLUCOSE, UA: NEGATIVE
Hgb urine dipstick: NEGATIVE
Ketones, ur: NEGATIVE
Leukocytes, UA: NEGATIVE
Nitrite: NEGATIVE
PH: 7.5 (ref 5.0–8.0)
PROTEIN: NEGATIVE
Specific Gravity, Urine: 1.02 (ref 1.001–1.03)

## 2017-05-09 LAB — BASIC METABOLIC PANEL WITH GFR
BUN: 13 mg/dL (ref 7–25)
CHLORIDE: 105 mmol/L (ref 98–110)
CO2: 27 mmol/L (ref 20–32)
Calcium: 9.7 mg/dL (ref 8.6–10.4)
Creat: 1.01 mg/dL (ref 0.50–1.05)
GFR, EST AFRICAN AMERICAN: 74 mL/min/{1.73_m2} (ref >=60)
GFR, EST NON AFRICAN AMERICAN: 64 mL/min/{1.73_m2} (ref >=60)
Glucose, Bld: 89 mg/dL (ref 65–99)
POTASSIUM: 5.1 mmol/L (ref 3.5–5.3)
Sodium: 137 mmol/L (ref 135–146)

## 2017-05-09 LAB — MICROALBUMIN / CREATININE URINE RATIO
Creatinine, Urine: 133 mg/dL (ref 20–275)
Microalb Creat Ratio: 3 ug/mg{creat}
Microalb, Ur: 0.4 mg/dL

## 2017-05-09 LAB — LIPID PANEL
Cholesterol: 196 mg/dL
HDL: 63 mg/dL
LDL Cholesterol (Calc): 116 mg/dL — ABNORMAL HIGH
Non-HDL Cholesterol (Calc): 133 mg/dL — ABNORMAL HIGH
Total CHOL/HDL Ratio: 3.1 (calc)
Triglycerides: 74 mg/dL

## 2017-05-09 LAB — CBC WITH DIFFERENTIAL/PLATELET
Basophils Absolute: 28 cells/uL (ref 0–200)
Basophils Relative: 0.5 %
Eosinophils Absolute: 129 cells/uL (ref 15–500)
Eosinophils Relative: 2.3 %
HEMATOCRIT: 42.1 % (ref 35.0–45.0)
HEMOGLOBIN: 14.2 g/dL (ref 11.7–15.5)
LYMPHS ABS: 1686 {cells}/uL (ref 850–3900)
MCH: 30.7 pg (ref 27.0–33.0)
MCHC: 33.7 g/dL (ref 32.0–36.0)
MCV: 91.1 fL (ref 80.0–100.0)
MONOS PCT: 10.9 %
MPV: 10.3 fL (ref 7.5–12.5)
NEUTROS ABS: 3147 {cells}/uL (ref 1500–7800)
Neutrophils Relative %: 56.2 %
Platelets: 280 10*3/uL (ref 140–400)
RBC: 4.62 10*6/uL (ref 3.80–5.10)
RDW: 12.2 % (ref 11.0–15.0)
Total Lymphocyte: 30.1 %
WBC mixed population: 610 cells/uL (ref 200–950)
WBC: 5.6 10*3/uL (ref 3.8–10.8)

## 2017-05-09 LAB — MAGNESIUM: Magnesium: 2.2 mg/dL (ref 1.5–2.5)

## 2017-05-09 LAB — TSH: TSH: 2.25 m[IU]/L

## 2017-05-16 ENCOUNTER — Other Ambulatory Visit: Payer: Self-pay | Admitting: Internal Medicine

## 2017-05-16 DIAGNOSIS — Z139 Encounter for screening, unspecified: Secondary | ICD-10-CM

## 2017-06-04 ENCOUNTER — Ambulatory Visit
Admission: RE | Admit: 2017-06-04 | Discharge: 2017-06-04 | Disposition: A | Discharge status: Home / Self Care | Payer: BLUE CROSS/BLUE SHIELD | Source: Ambulatory Visit | Attending: Internal Medicine | Admitting: Internal Medicine

## 2017-06-04 DIAGNOSIS — Z1231 Encounter for screening mammogram for malignant neoplasm of breast: Secondary | ICD-10-CM | POA: Diagnosis not present

## 2017-06-04 DIAGNOSIS — Z139 Encounter for screening, unspecified: Secondary | ICD-10-CM

## 2017-06-06 ENCOUNTER — Other Ambulatory Visit: Payer: Self-pay | Admitting: Internal Medicine

## 2017-06-06 DIAGNOSIS — R928 Other abnormal and inconclusive findings on diagnostic imaging of breast: Secondary | ICD-10-CM

## 2017-06-11 ENCOUNTER — Other Ambulatory Visit: Payer: Self-pay | Admitting: Internal Medicine

## 2017-06-11 ENCOUNTER — Ambulatory Visit
Admission: RE | Admit: 2017-06-11 | Discharge: 2017-06-11 | Disposition: A | Discharge status: Home / Self Care | Payer: BLUE CROSS/BLUE SHIELD | Source: Ambulatory Visit | Attending: Internal Medicine | Admitting: Internal Medicine

## 2017-06-11 DIAGNOSIS — R928 Other abnormal and inconclusive findings on diagnostic imaging of breast: Secondary | ICD-10-CM

## 2017-06-11 DIAGNOSIS — R921 Mammographic calcification found on diagnostic imaging of breast: Secondary | ICD-10-CM | POA: Diagnosis not present

## 2017-06-12 ENCOUNTER — Other Ambulatory Visit: Payer: Self-pay | Admitting: Internal Medicine

## 2017-06-12 DIAGNOSIS — R928 Other abnormal and inconclusive findings on diagnostic imaging of breast: Secondary | ICD-10-CM

## 2017-06-14 ENCOUNTER — Ambulatory Visit
Admission: RE | Admit: 2017-06-14 | Discharge: 2017-06-14 | Disposition: A | Discharge status: Home / Self Care | Payer: BLUE CROSS/BLUE SHIELD | Source: Ambulatory Visit | Attending: Internal Medicine | Admitting: Internal Medicine

## 2017-06-14 ENCOUNTER — Other Ambulatory Visit: Payer: Self-pay | Admitting: Physician Assistant

## 2017-06-14 DIAGNOSIS — R928 Other abnormal and inconclusive findings on diagnostic imaging of breast: Secondary | ICD-10-CM

## 2017-06-14 MED ORDER — DIAZEPAM 2 MG PO TABS: ORAL_TABLET | ORAL | 0 refills | Status: DC

## 2017-06-18 ENCOUNTER — Other Ambulatory Visit: Payer: Self-pay | Admitting: Internal Medicine

## 2017-06-18 DIAGNOSIS — R928 Other abnormal and inconclusive findings on diagnostic imaging of breast: Secondary | ICD-10-CM

## 2017-06-20 ENCOUNTER — Ambulatory Visit
Admission: RE | Admit: 2017-06-20 | Discharge: 2017-06-20 | Disposition: A | Discharge status: Home / Self Care | Payer: BLUE CROSS/BLUE SHIELD | Source: Ambulatory Visit | Attending: Internal Medicine | Admitting: Internal Medicine

## 2017-06-20 DIAGNOSIS — R921 Mammographic calcification found on diagnostic imaging of breast: Secondary | ICD-10-CM | POA: Diagnosis not present

## 2017-06-20 DIAGNOSIS — N6012 Diffuse cystic mastopathy of left breast: Secondary | ICD-10-CM | POA: Diagnosis not present

## 2017-06-20 DIAGNOSIS — R928 Other abnormal and inconclusive findings on diagnostic imaging of breast: Secondary | ICD-10-CM

## 2017-07-03 ENCOUNTER — Other Ambulatory Visit: Payer: Self-pay | Admitting: Surgery

## 2017-07-03 DIAGNOSIS — R897 Abnormal histological findings in specimens from other organs, systems and tissues: Secondary | ICD-10-CM

## 2017-07-06 ENCOUNTER — Telehealth: Payer: Self-pay | Admitting: Internal Medicine

## 2017-07-06 NOTE — Telephone Encounter (Signed)
patient  requests call from [facility] to review mammogram results. Patient has done screening, diagnostic, and needle biopsy @  [facility]. scheduled for  consult with Dr Coralie Keens 08-10-17

## 2017-07-10 DIAGNOSIS — H6123 Impacted cerumen, bilateral: Secondary | ICD-10-CM | POA: Diagnosis not present

## 2017-08-06 ENCOUNTER — Other Ambulatory Visit: Payer: Self-pay

## 2017-08-06 ENCOUNTER — Encounter (HOSPITAL_BASED_OUTPATIENT_CLINIC_OR_DEPARTMENT_OTHER): Payer: Self-pay | Admitting: *Deleted

## 2017-08-09 ENCOUNTER — Ambulatory Visit
Admission: RE | Admit: 2017-08-09 | Discharge: 2017-08-09 | Disposition: A | Discharge status: Home / Self Care | Payer: BLUE CROSS/BLUE SHIELD | Source: Ambulatory Visit | Attending: Surgery | Admitting: Surgery

## 2017-08-09 DIAGNOSIS — R897 Abnormal histological findings in specimens from other organs, systems and tissues: Secondary | ICD-10-CM

## 2017-08-09 DIAGNOSIS — R920 Mammographic microcalcification found on diagnostic imaging of breast: Secondary | ICD-10-CM | POA: Diagnosis not present

## 2017-08-09 NOTE — H&P (Addendum)
Wanda Wilson  Location: [facility] Patient #: [id number] DOB: Mar 18, 1965 Single / Language: Cleophus Wilson / Race: White Female   History of Present Illness  The patient is a 52 year old female who presents with a complaint of Breast problems. This patient is referred by Dr. 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 She had gone for screening mammography when some small calcifications were seen in the upper outer quadrant left breast. A stereotactic biopsy was performed. This showed atypical spindle cells. The left breast lumpectomy is recommended for complete histologic evaluation. She's had no previous problems with her breasts. There is no family history of breast cancer. She is otherwise healthy without complaints.   Past Surgical History Malachi Bonds, CMA;  Breast Biopsy  Left. Oral Surgery  Tonsillectomy   Diagnostic Studies History Malachi Bonds, CMA;  Colonoscopy  within last year Mammogram  within last year Pap Smear  1-5 years ago  Allergies  No Known Drug Allergies   Medication History Malachi Bonds, CMA;  Tylenol (500MG  Capsule, Oral) Active. Aleve (220MG  Capsule, Oral) Active. Medications Reconciled  Social History   Alcohol use  Occasional alcohol use. Caffeine use  Carbonated beverages, Coffee, Tea. No drug use  Tobacco use  Never smoker.  Family History  Alcohol Abuse  Brother. Anesthetic complications  Mother. Arthritis  Father, Sister. Prostate Cancer  Father.  Pregnancy / Birth History  Age at menarche  62 years. Gravida  0 Para  0 Regular periods   Other Problems Umbilical Hernia Repair     Review of Systems  General Not Present- Appetite Loss, Chills, Fatigue, Fever, Night Sweats, Weight Gain and Weight Loss. Skin Present- Dryness. Not Present- Change in Wart/Mole, Hives, Jaundice, New Lesions, Non-Healing Wounds, Rash and Ulcer. HEENT Present- Wears  glasses/contact lenses. Not Present- Earache, Hearing Loss, Hoarseness, Nose Bleed, Oral Ulcers, Ringing in the Ears, Seasonal Allergies, Sinus Pain, Sore Throat, Visual Disturbances and Yellow Eyes. Respiratory Not Present- Bloody sputum, Chronic Cough, Difficulty Breathing, Snoring and Wheezing. Breast Not Present- Breast Mass, Breast Pain, Nipple Discharge and Skin Changes. Cardiovascular Not Present- Chest Pain, Difficulty Breathing Lying Down, Leg Cramps, Palpitations, Rapid Heart Rate, Shortness of Breath and Swelling of Extremities. Gastrointestinal Not Present- Abdominal Pain, Bloating, Bloody Stool, Change in Bowel Habits, Chronic diarrhea, Constipation, Difficulty Swallowing, Excessive gas, Gets full quickly at meals, Hemorrhoids, Indigestion, Nausea, Rectal Pain and Vomiting. Female Genitourinary Not Present- Frequency, Nocturia, Painful Urination, Pelvic Pain and Urgency. Musculoskeletal Not Present- Back Pain, Joint Pain, Joint Stiffness, Muscle Pain, Muscle Weakness and Swelling of Extremities. Neurological Not Present- Decreased Memory, Fainting, Headaches, Numbness, Seizures, Tingling, Tremor, Trouble walking and Weakness. Psychiatric Not Present- Anxiety, Bipolar, Change in Sleep Pattern, Depression, Fearful and Frequent crying. Endocrine Not Present- Cold Intolerance, Excessive Hunger, Hair Changes, Heat Intolerance, Hot flashes and New Diabetes. Hematology Present- Easy Bruising. Not Present- Blood Thinners, Excessive bleeding, Gland problems, HIV and Persistent Infections.  Vitals  Weight: 160.8 lb Height: 66in Body Surface Area: 1.82 m Body Mass Index: 25.95 kg/m  Pulse: 75 (Regular)  BP: 130/78 (Sitting, Left Arm, Standard)    Physical Exam (Kelia Gibbon A. Ninfa Linden MD;  General Mental Status-Alert. General Appearance-Consistent with stated age. Hydration-Well hydrated. Voice-Normal.  Head and Neck Head-normocephalic, atraumatic with no lesions or  palpable masses. Trachea-midline. Thyroid Gland Characteristics - normal size and consistency.  Eye Eyeball - Bilateral-Extraocular movements intact. Sclera/Conjunctiva - Bilateral-No scleral icterus.  Chest and Lung Exam Chest and lung exam  reveals -quiet, even and easy respiratory effort with no use of accessory muscles and on auscultation, normal breath sounds, no adventitious sounds and normal vocal resonance. Inspection Chest Wall - Normal. Back - normal.  Breast Breast - Left-Symmetric, Non Tender, No Biopsy scars, no Dimpling, No Inflammation, No Lumpectomy scars, No Mastectomy scars, No Peau d' Orange. Note: There is ecchymosis at the side of the biopsy of the breast. There are no palpable left breast masses Breast - Right-Symmetric, Non Tender, No Biopsy scars, no Dimpling, No Inflammation, No Lumpectomy scars, No Mastectomy scars, No Peau d' Orange. Breast Lump-No Palpable Breast Mass.  Cardiovascular Cardiovascular examination reveals -normal heart sounds, regular rate and rhythm with no murmurs and normal pedal pulses bilaterally.  Abdomen - Did not examine.  Neurologic - Did not examine.  Musculoskeletal - Did not examine.  Lymphatic Head & Neck  General Head & Neck Lymphatics: Bilateral - Description - Normal. Axillary  General Axillary Region: Bilateral - Description - Normal. Tenderness - Non Tender. Femoral & Inguinal - Did not examine.    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 Because of the atypia, complete removal of the area of the left breast is recommended. I explained this to her in detail. I gave her a copy of the pathology results. I discussed a radioactive seed guided left breast lumpectomy in detail. We discussed the surgical procedure including its risks. She understands and wishes to proceed  with surgery which will be scheduled

## 2017-08-09 NOTE — Progress Notes (Signed)
Ensure pre surgery drink given with instructions to complete by 0530 dos, surgical soap given with instructions, pt verbalized understanding. 

## 2017-08-10 ENCOUNTER — Encounter (HOSPITAL_BASED_OUTPATIENT_CLINIC_OR_DEPARTMENT_OTHER)
Admission: RE | Disposition: A | Discharge status: Home / Self Care | Payer: Self-pay | Source: Ambulatory Visit | Attending: Surgery

## 2017-08-10 ENCOUNTER — Encounter (HOSPITAL_BASED_OUTPATIENT_CLINIC_OR_DEPARTMENT_OTHER): Payer: Self-pay

## 2017-08-10 ENCOUNTER — Other Ambulatory Visit: Payer: Self-pay

## 2017-08-10 ENCOUNTER — Ambulatory Visit (HOSPITAL_BASED_OUTPATIENT_CLINIC_OR_DEPARTMENT_OTHER)
Admission: RE | Admit: 2017-08-10 | Discharge: 2017-08-10 | Disposition: A | Discharge status: Home / Self Care | Payer: BLUE CROSS/BLUE SHIELD | Source: Ambulatory Visit | Attending: Surgery | Admitting: Surgery

## 2017-08-10 ENCOUNTER — Ambulatory Visit (HOSPITAL_BASED_OUTPATIENT_CLINIC_OR_DEPARTMENT_OTHER): Payer: BLUE CROSS/BLUE SHIELD | Admitting: Certified Registered"

## 2017-08-10 ENCOUNTER — Ambulatory Visit
Admission: RE | Admit: 2017-08-10 | Discharge: 2017-08-10 | Disposition: A | Discharge status: Home / Self Care | Payer: BLUE CROSS/BLUE SHIELD | Source: Ambulatory Visit | Attending: Surgery | Admitting: Surgery

## 2017-08-10 DIAGNOSIS — M1990 Unspecified osteoarthritis, unspecified site: Secondary | ICD-10-CM | POA: Diagnosis not present

## 2017-08-10 DIAGNOSIS — N6489 Other specified disorders of breast: Secondary | ICD-10-CM | POA: Insufficient documentation

## 2017-08-10 DIAGNOSIS — C50912 Malignant neoplasm of unspecified site of left female breast: Secondary | ICD-10-CM | POA: Diagnosis not present

## 2017-08-10 DIAGNOSIS — Z791 Long term (current) use of non-steroidal anti-inflammatories (NSAID): Secondary | ICD-10-CM | POA: Diagnosis not present

## 2017-08-10 DIAGNOSIS — F329 Major depressive disorder, single episode, unspecified: Secondary | ICD-10-CM | POA: Diagnosis not present

## 2017-08-10 DIAGNOSIS — D242 Benign neoplasm of left breast: Secondary | ICD-10-CM | POA: Diagnosis not present

## 2017-08-10 DIAGNOSIS — R928 Other abnormal and inconclusive findings on diagnostic imaging of breast: Secondary | ICD-10-CM | POA: Diagnosis not present

## 2017-08-10 DIAGNOSIS — R897 Abnormal histological findings in specimens from other organs, systems and tissues: Secondary | ICD-10-CM | POA: Diagnosis not present

## 2017-08-10 DIAGNOSIS — D0512 Intraductal carcinoma in situ of left breast: Secondary | ICD-10-CM | POA: Insufficient documentation

## 2017-08-10 HISTORY — PX: BREAST LUMPECTOMY WITH RADIOACTIVE SEED LOCALIZATION: SHX6424

## 2017-08-10 SURGERY — BREAST LUMPECTOMY WITH RADIOACTIVE SEED LOCALIZATION
Anesthesia: General | Site: Breast | Laterality: Left

## 2017-08-10 MED ORDER — SCOPOLAMINE 1 MG/3DAYS TD PT72
1.0000 | MEDICATED_PATCH | Freq: Once | TRANSDERMAL | Status: DC | PRN
  Administered 2017-08-10: 1.5 mg via TRANSDERMAL

## 2017-08-10 MED ORDER — GABAPENTIN 300 MG PO CAPS
300.0000 mg | ORAL_CAPSULE | ORAL | Status: AC
  Administered 2017-08-10: 300 mg via ORAL

## 2017-08-10 MED ORDER — CEFAZOLIN SODIUM-DEXTROSE 2-4 GM/100ML-% IV SOLN
2.0000 g | INTRAVENOUS | Status: AC
  Administered 2017-08-10: 2 g via INTRAVENOUS

## 2017-08-10 MED ORDER — LACTATED RINGERS IV SOLN
INTRAVENOUS | Status: DC
  Administered 2017-08-10 (×2): via INTRAVENOUS

## 2017-08-10 MED ORDER — CHLORHEXIDINE GLUCONATE CLOTH 2 % EX PADS: 6.0000 | MEDICATED_PAD | Freq: Once | CUTANEOUS | Status: DC

## 2017-08-10 MED ORDER — OXYCODONE HCL 5 MG PO TABS: 5.0000 mg | ORAL_TABLET | Freq: Once | ORAL | Status: DC | PRN

## 2017-08-10 MED ORDER — MEPERIDINE HCL 25 MG/ML IJ SOLN: 6.2500 mg | INTRAMUSCULAR | Status: DC | PRN

## 2017-08-10 MED ORDER — LIDOCAINE HCL (CARDIAC) PF 100 MG/5ML IV SOSY
PREFILLED_SYRINGE | INTRAVENOUS | Status: DC | PRN
  Administered 2017-08-10: 60 mg via INTRAVENOUS

## 2017-08-10 MED ORDER — CEFAZOLIN SODIUM-DEXTROSE 2-4 GM/100ML-% IV SOLN
INTRAVENOUS | Status: AC
  Filled 2017-08-10: qty 100

## 2017-08-10 MED ORDER — DEXAMETHASONE SODIUM PHOSPHATE 4 MG/ML IJ SOLN
INTRAMUSCULAR | Status: DC | PRN
  Administered 2017-08-10: 10 mg via INTRAVENOUS

## 2017-08-10 MED ORDER — CELECOXIB 200 MG PO CAPS
ORAL_CAPSULE | ORAL | Status: AC
  Filled 2017-08-10: qty 1

## 2017-08-10 MED ORDER — ACETAMINOPHEN 500 MG PO TABS
1000.0000 mg | ORAL_TABLET | ORAL | Status: AC
  Administered 2017-08-10: 1000 mg via ORAL

## 2017-08-10 MED ORDER — PROMETHAZINE HCL 25 MG/ML IJ SOLN: 6.2500 mg | INTRAMUSCULAR | Status: DC | PRN

## 2017-08-10 MED ORDER — FENTANYL CITRATE (PF) 100 MCG/2ML IJ SOLN
50.0000 ug | INTRAMUSCULAR | Status: DC | PRN
  Administered 2017-08-10: 100 ug via INTRAVENOUS

## 2017-08-10 MED ORDER — OXYCODONE HCL 5 MG/5ML PO SOLN: 5.0000 mg | Freq: Once | ORAL | Status: DC | PRN

## 2017-08-10 MED ORDER — ACETAMINOPHEN 500 MG PO TABS
ORAL_TABLET | ORAL | Status: AC
  Filled 2017-08-10: qty 2

## 2017-08-10 MED ORDER — PROPOFOL 10 MG/ML IV BOLUS
INTRAVENOUS | Status: DC | PRN
  Administered 2017-08-10: 200 mg via INTRAVENOUS

## 2017-08-10 MED ORDER — ONDANSETRON HCL 4 MG/2ML IJ SOLN
INTRAMUSCULAR | Status: DC | PRN
Start: 2017-08-10 — End: 2017-08-10
  Administered 2017-08-10: 4 mg via INTRAVENOUS

## 2017-08-10 MED ORDER — CELECOXIB 200 MG PO CAPS
200.0000 mg | ORAL_CAPSULE | ORAL | Status: AC
  Administered 2017-08-10: 200 mg via ORAL

## 2017-08-10 MED ORDER — BUPIVACAINE-EPINEPHRINE 0.5% -1:200000 IJ SOLN
INTRAMUSCULAR | Status: DC | PRN
  Administered 2017-08-10: 8 mL
  Administered 2017-08-10: 7 mL

## 2017-08-10 MED ORDER — SCOPOLAMINE 1 MG/3DAYS TD PT72
MEDICATED_PATCH | TRANSDERMAL | Status: AC
  Filled 2017-08-10: qty 1

## 2017-08-10 MED ORDER — FENTANYL CITRATE (PF) 100 MCG/2ML IJ SOLN: 25.0000 ug | INTRAMUSCULAR | Status: DC | PRN

## 2017-08-10 MED ORDER — MIDAZOLAM HCL 2 MG/2ML IJ SOLN
INTRAMUSCULAR | Status: AC
  Filled 2017-08-10: qty 2

## 2017-08-10 MED ORDER — MIDAZOLAM HCL 2 MG/2ML IJ SOLN
1.0000 mg | INTRAMUSCULAR | Status: DC | PRN
  Administered 2017-08-10: 2 mg via INTRAVENOUS

## 2017-08-10 MED ORDER — GABAPENTIN 300 MG PO CAPS
ORAL_CAPSULE | ORAL | Status: AC
  Filled 2017-08-10: qty 1

## 2017-08-10 MED ORDER — OXYCODONE HCL 5 MG PO TABS: 5.0000 mg | ORAL_TABLET | Freq: Four times a day (QID) | ORAL | 0 refills | Status: DC | PRN

## 2017-08-10 SURGICAL SUPPLY — 48 items
ADH SKN CLS APL DERMABOND .7 (GAUZE/BANDAGES/DRESSINGS) ×1
APPLIER CLIP 9.375 MED OPEN (MISCELLANEOUS)
APR CLP MED 9.3 20 MLT OPN (MISCELLANEOUS)
BLADE HEX COATED 2.75 (ELECTRODE) ×2 IMPLANT
BLADE SURG 15 STRL LF DISP TIS (BLADE) ×1 IMPLANT
BLADE SURG 15 STRL SS (BLADE) ×2
CANISTER SUC SOCK COL 7IN (MISCELLANEOUS) IMPLANT
CANISTER SUCT 1200ML W/VALVE (MISCELLANEOUS) IMPLANT
CHLORAPREP W/TINT 26ML (MISCELLANEOUS) ×2 IMPLANT
CLIP APPLIE 9.375 MED OPEN (MISCELLANEOUS) IMPLANT
CLIP VESOCCLUDE SM WIDE 6/CT (CLIP) ×1 IMPLANT
COVER BACK TABLE 60X90IN (DRAPES) ×2 IMPLANT
COVER MAYO STAND STRL (DRAPES) ×2 IMPLANT
COVER PROBE W GEL 5X96 (DRAPES) ×2 IMPLANT
DECANTER SPIKE VIAL GLASS SM (MISCELLANEOUS) IMPLANT
DERMABOND ADVANCED (GAUZE/BANDAGES/DRESSINGS) ×1
DERMABOND ADVANCED .7 DNX12 (GAUZE/BANDAGES/DRESSINGS) ×1 IMPLANT
DEVICE DUBIN W/COMP PLATE 8390 (MISCELLANEOUS) ×2 IMPLANT
DRAPE LAPAROSCOPIC ABDOMINAL (DRAPES) ×2 IMPLANT
DRAPE UTILITY XL STRL (DRAPES) ×2 IMPLANT
ELECT REM PT RETURN 9FT ADLT (ELECTROSURGICAL) ×2
ELECTRODE REM PT RTRN 9FT ADLT (ELECTROSURGICAL) ×1 IMPLANT
GAUZE SPONGE 4X4 12PLY STRL LF (GAUZE/BANDAGES/DRESSINGS) IMPLANT
GLOVE BIO SURGEON STRL SZ 6.5 (GLOVE) ×1 IMPLANT
GLOVE BIOGEL PI IND STRL 7.0 (GLOVE) ×2 IMPLANT
GLOVE BIOGEL PI INDICATOR 7.0 (GLOVE) ×2
GLOVE SURG SIGNA 7.5 PF LTX (GLOVE) ×2 IMPLANT
GOWN STRL REUS W/ TWL LRG LVL3 (GOWN DISPOSABLE) ×1 IMPLANT
GOWN STRL REUS W/ TWL XL LVL3 (GOWN DISPOSABLE) ×1 IMPLANT
GOWN STRL REUS W/TWL LRG LVL3 (GOWN DISPOSABLE) ×2
GOWN STRL REUS W/TWL XL LVL3 (GOWN DISPOSABLE) ×2
KIT MARKER MARGIN INK (KITS) ×2 IMPLANT
NDL HYPO 25X1 1.5 SAFETY (NEEDLE) ×1 IMPLANT
NEEDLE HYPO 25X1 1.5 SAFETY (NEEDLE) ×2 IMPLANT
NS IRRIG 1000ML POUR BTL (IV SOLUTION) ×2 IMPLANT
PACK BASIN DAY SURGERY FS (CUSTOM PROCEDURE TRAY) ×2 IMPLANT
PENCIL BUTTON HOLSTER BLD 10FT (ELECTRODE) ×2 IMPLANT
SLEEVE SCD COMPRESS KNEE MED (MISCELLANEOUS) ×2 IMPLANT
SPONGE LAP 4X18 RFD (DISPOSABLE) ×2 IMPLANT
SUT MNCRL AB 4-0 PS2 18 (SUTURE) ×2 IMPLANT
SUT SILK 2 0 SH (SUTURE) IMPLANT
SUT VIC AB 3-0 SH 27 (SUTURE) ×2
SUT VIC AB 3-0 SH 27X BRD (SUTURE) ×1 IMPLANT
SYR CONTROL 10ML LL (SYRINGE) ×2 IMPLANT
TOWEL GREEN STERILE FF (TOWEL DISPOSABLE) ×2 IMPLANT
TOWEL OR NON WOVEN STRL DISP B (DISPOSABLE) IMPLANT
TUBE CONNECTING 20X1/4 (TUBING) IMPLANT
YANKAUER SUCT BULB TIP NO VENT (SUCTIONS) IMPLANT

## 2017-08-10 NOTE — Transfer of Care (Signed)
Immediate Anesthesia Transfer of Care Note  Patient: Wanda Wilson  Procedure(s) Performed: BREAST LUMPECTOMY WITH RADIOACTIVE SEED LOCALIZATION (Left Breast)  Patient Location: PACU  Anesthesia Type:General  Level of Consciousness: awake and patient cooperative  Airway & Oxygen Therapy: Patient Spontanous Breathing and Patient connected to face mask oxygen  Post-op Assessment: Report given to RN and Post -op Vital signs reviewed and stable  Post vital signs: Reviewed and stable  Last Vitals:  Vitals Value Taken Time  BP 102/56 08/10/2017  9:20 AM  Temp    Pulse 68 08/10/2017  9:21 AM  Resp 9 08/10/2017  9:21 AM  SpO2 98 % 08/10/2017  9:21 AM  Vitals shown include unvalidated device data.  Last Pain:  Vitals:   08/10/17 0742  TempSrc: Oral  PainSc: 0-No pain         Complications: No apparent anesthesia complications

## 2017-08-10 NOTE — Anesthesia Procedure Notes (Signed)
Procedure Name: LMA Insertion Date/Time: 08/10/2017 8:34 AM Performed by: Signe Colt, CRNA Pre-anesthesia Checklist: Patient identified, Emergency Drugs available, Suction available and Patient being monitored Patient Re-evaluated:Patient Re-evaluated prior to induction Oxygen Delivery Method: Circle system utilized Preoxygenation: Pre-oxygenation with 100% oxygen Induction Type: IV induction Ventilation: Mask ventilation without difficulty LMA: LMA inserted LMA Size: 4.0 Number of attempts: 1 Airway Equipment and Method: Bite block Placement Confirmation: positive ETCO2 Tube secured with: Tape Dental Injury: Teeth and Oropharynx as per pre-operative assessment

## 2017-08-10 NOTE — Interval H&P Note (Signed)
History and Physical Interval Note: no change in H and P  08/10/2017 7:40 AM  Wanda Wilson  has presented today for surgery, with the diagnosis of LEFT BREAST ATYPICAL CELLS  The various methods of treatment have been discussed with the patient and family. After consideration of risks, benefits and other options for treatment, the patient has consented to  Procedure(s): BREAST LUMPECTOMY WITH RADIOACTIVE SEED LOCALIZATION (Left) as a surgical intervention .  The patient's history has been reviewed, patient examined, no change in status, stable for surgery.  I have reviewed the patient's chart and labs.  Questions were answered to the patient's satisfaction.     Jarius Dieudonne A

## 2017-08-10 NOTE — Anesthesia Preprocedure Evaluation (Addendum)
Anesthesia Evaluation  Patient identified by MRN, date of birth, ID band Patient awake    Reviewed: Allergy & Precautions, NPO status , Patient's Chart, lab work & pertinent test results  Airway Mallampati: II  TM Distance: >3 FB Neck ROM: Full    Dental no notable dental hx.    Pulmonary neg pulmonary ROS,    Pulmonary exam normal breath sounds clear to auscultation       Cardiovascular negative cardio ROS Normal cardiovascular exam Rhythm:Regular Rate:Normal     Neuro/Psych PSYCHIATRIC DISORDERS Depression negative neurological ROS     GI/Hepatic negative GI ROS, Neg liver ROS,   Endo/Other  negative endocrine ROS  Renal/GU negative Renal ROS     Musculoskeletal  (+) Arthritis ,   Abdominal   Peds  Hematology negative hematology ROS (+)   Anesthesia Other Findings   Reproductive/Obstetrics negative OB ROS                             Anesthesia Physical Anesthesia Plan  ASA: II  Anesthesia Plan: General   Post-op Pain Management:    Induction: Intravenous  PONV Risk Score and Plan: 3 and Ondansetron, Dexamethasone, Midazolam and Scopolamine patch - Pre-op  Airway Management Planned: LMA  Additional Equipment:   Intra-op Plan:   Post-operative Plan: Extubation in OR  Informed Consent: I have reviewed the patients History and Physical, chart, labs and discussed the procedure including the risks, benefits and alternatives for the proposed anesthesia with the patient or authorized representative who has indicated his/her understanding and acceptance.   Dental advisory given  Plan Discussed with: CRNA  Anesthesia Plan Comments:         Anesthesia Quick Evaluation

## 2017-08-10 NOTE — Discharge Instructions (Signed)
NO TYLENOL BEFORE 2 PM!      [employer] Phone Number [phone]  BREAST BIOPSY/ PARTIAL MASTECTOMY: POST OP INSTRUCTIONS  Always review your discharge instruction sheet given to you by the facility where your surgery was performed.  IF YOU HAVE DISABILITY OR FAMILY LEAVE FORMS, YOU MUST BRING THEM TO THE OFFICE FOR PROCESSING.  DO NOT GIVE THEM TO YOUR DOCTOR.  1. A prescription for pain medication may be given to you upon discharge.  Take your pain medication as prescribed, if needed.  If narcotic pain medicine is not needed, then you may take acetaminophen (Tylenol) or ibuprofen (Advil) as needed. 2. Take your usually prescribed medications unless otherwise directed 3. If you need a refill on your pain medication, please contact your pharmacy.  They will contact our office to request authorization.  Prescriptions will not be filled after 5pm or on week-ends. 4. You should eat very light the first 24 hours after surgery, such as soup, crackers, pudding, etc.  Resume your normal diet the day after surgery. 5. Most patients will experience some swelling and bruising in the breast.  Ice packs and a good support bra will help.  Swelling and bruising can take several days to resolve.  6. It is common to experience some constipation if taking pain medication after surgery.  Increasing fluid intake and taking a stool softener will usually help or prevent this problem from occurring.  A mild laxative (Milk of Magnesia or Miralax) should be taken according to package directions if there are no bowel movements after 48 hours. 7. Unless discharge instructions indicate otherwise, you may remove your bandages 24-48 hours after surgery, and you may shower at that time.  You may have steri-strips (small skin tapes) in place directly over the incision.  These strips should be left on the skin for 7-10 days.  If your surgeon used skin glue on the incision, you may shower in 24 hours.   The glue will flake off over the next 2-3 weeks.  Any sutures or staples will be removed at the office during your follow-up visit. 8. ACTIVITIES:  You may resume regular daily activities (gradually increasing) beginning the next day.  Wearing a good support bra or sports bra minimizes pain and swelling.  You may have sexual intercourse when it is comfortable. a. You may drive when you no longer are taking prescription pain medication, you can comfortably wear a seatbelt, and you can safely maneuver your car and apply brakes. b. RETURN TO WORK:  ______________________________________________________________________________________ 9. You should see your doctor in the office for a follow-up appointment approximately two weeks after your surgery.  Your doctors nurse will typically make your follow-up appointment when she calls you with your pathology report.  Expect your pathology report 2-3 business days after your surgery.  You may call to check if you do not hear from [place] after three days. 10. OTHER INSTRUCTIONS: _OK TO SHOWER STARTING TOMORROW 11. ICE PACK, TYLENOL, IBUPROFEN ALSO FOR PAIN 12. NO VIGOROUS ACTIVITY FOR ONE WEEK 13. ______________________________________________________________________________________________ _____________________________________________________________________________________________________________________________________ _____________________________________________________________________________________________________________________________________ _____________________________________________________________________________________________________________________________________  WHEN TO CALL YOUR DOCTOR: 1. Fever over 101.0 2. Nausea and/or vomiting. 3. Extreme swelling or bruising. 4. Continued bleeding from incision. 5. Increased pain, redness, or drainage from the incision.  The clinic staff is available to answer your questions during regular business  hours.  Please dont hesitate to call and ask to speak to one of the nurses for clinical concerns.  If you have a medical emergency,  go to the nearest emergency room or call 911.  A surgeon from [facility] is always on call at the hospital.  For further questions, please visit [email or web address]     Post Anesthesia Home Care Instructions  Activity: Get plenty of rest for the remainder of the day. A responsible individual must stay with you for 24 hours following the procedure.  For the next 24 hours, DO NOT: -Drive a car -Paediatric nurse -Drink alcoholic beverages -Take any medication unless instructed by your physician -Make any legal decisions or sign important papers.  Meals: Start with liquid foods such as gelatin or soup. Progress to regular foods as tolerated. Avoid greasy, spicy, heavy foods. If nausea and/or vomiting occur, drink only clear liquids until the nausea and/or vomiting subsides. Call your physician if vomiting continues.  Special Instructions/Symptoms: Your throat may feel dry or sore from the anesthesia or the breathing tube placed in your throat during surgery. If this causes discomfort, gargle with warm salt water. The discomfort should disappear within 24 hours.  If you had a scopolamine patch placed behind your ear for the management of post- operative nausea and/or vomiting:  1. The medication in the patch is effective for 72 hours, after which it should be removed.  Wrap patch in a tissue and discard in the trash. Wash hands thoroughly with soap and water. 2. You may remove the patch earlier than 72 hours if you experience unpleasant side effects which may include dry mouth, dizziness or visual disturbances. 3. Avoid touching the patch. Wash your hands with soap and water after contact with the patch.

## 2017-08-10 NOTE — Op Note (Signed)
BREAST LUMPECTOMY WITH RADIOACTIVE SEED LOCALIZATION  Procedure Note  Wanda Wilson 08/10/2017   Pre-op Diagnosis: LEFT BREAST ATYPICAL CELLS     Post-op Diagnosis: same  Procedure(s): LEFT BREAST LUMPECTOMY WITH RADIOACTIVE SEED LOCALIZATION  Surgeon(s): Coralie Keens, MD  Anesthesia: General  Staff:  Circulator: Lisbeth Ply, RN Relief Circulator: Eston Esters, RN Scrub Person: Mickie Bail, CST  Estimated Blood Loss: Minimal               Specimens: SENT TO PATH  Indications: This is a 52 year old female who was found to have abnormal calcifications in the left breast on screening mammography.  A stereotactic biopsy was performed of these showing atypical spindle cells.  The decision was made to excise this area in the operating room.  Findings: The radioactive seed had been placed by radiologist at the breast center.  It was at the area of the calcifications.  The previous marker was not at the calcifications as mentioned by the radiologist.  I was able to excise the radioactive seed and the calcifications.  There was a nodular area adjacent to this which I also removed.  I could not identify the originally placed biopsy clip which again was not at the area of concern.  Procedure: The patient was identified in the preoperative holding area.  The neoprobe was used to confirm that the radioactive seed was in the left breast.  She was then taken to the operating room.  She was placed upon the operating table and general anesthesia was induced.  Her left breast was prepped and draped in usual sterile fashion.  I anesthetized the edge of the lateral areola with Marcaine.  I then made an incision with a scalpel.  Using the neoprobe, I dissected down into the breast tissue and performed a lumpectomy staying around the radioactive seed.  Tissue just inferior and deep to the lumpectomy specimen felt nodular so I excised this as well.  X-ray was performed on specimens confirming  that the radioactive seed and calcifications were in the specimen but the original biopsy marker was not.  I again examined the cavity and could not identify this.  There were no other palpable abnormalities.  At this point I placed a surgical clip in the breast tissue for identification purposes.  I anesthetized the wound further with Marcaine.  Hemostasis appeared to be achieved.  I then closed the subcutaneous tissue with interrupted 3-0 Vicryl sutures and closed the skin with a running 4-0 Monocryl.  Dermabond was then applied.  The patient tolerated the procedure well.  All the counts were correct at the end of the procedure.  The patient was then extubated in the operating room and taken in a stable condition to the recovery room.          [name]   Date: 08/10/2017  Time: 9:15 AM

## 2017-08-10 NOTE — Anesthesia Postprocedure Evaluation (Signed)
Anesthesia Post Note  Patient: Wanda Wilson  Procedure(s) Performed: BREAST LUMPECTOMY WITH RADIOACTIVE SEED LOCALIZATION (Left Breast)     Patient location during evaluation: PACU Anesthesia Type: General Level of consciousness: sedated and patient cooperative Pain management: pain level controlled Vital Signs Assessment: post-procedure vital signs reviewed and stable Respiratory status: spontaneous breathing Cardiovascular status: stable Anesthetic complications: no    Last Vitals:  Vitals:   08/10/17 0952 08/10/17 1044  BP:  122/64  Pulse: 73 66  Resp: (!) 24 20  Temp:  36.5 C  SpO2: 99% 100%    Last Pain:  Vitals:   08/10/17 1044  TempSrc:   PainSc: 0-No pain                 Nolon Nations

## 2017-08-13 ENCOUNTER — Encounter (HOSPITAL_BASED_OUTPATIENT_CLINIC_OR_DEPARTMENT_OTHER): Payer: Self-pay | Admitting: Surgery

## 2017-08-17 ENCOUNTER — Other Ambulatory Visit: Payer: Self-pay | Admitting: Surgery

## 2017-08-22 DIAGNOSIS — N6012 Diffuse cystic mastopathy of left breast: Secondary | ICD-10-CM | POA: Diagnosis not present

## 2017-08-22 DIAGNOSIS — R920 Mammographic microcalcification found on diagnostic imaging of breast: Secondary | ICD-10-CM | POA: Diagnosis not present

## 2017-08-22 DIAGNOSIS — Z9889 Other specified postprocedural states: Secondary | ICD-10-CM | POA: Diagnosis not present

## 2017-08-22 DIAGNOSIS — D0512 Intraductal carcinoma in situ of left breast: Secondary | ICD-10-CM | POA: Diagnosis not present

## 2017-08-22 DIAGNOSIS — D0592 Unspecified type of carcinoma in situ of left breast: Secondary | ICD-10-CM | POA: Diagnosis not present

## 2017-08-24 ENCOUNTER — Encounter: Payer: Self-pay | Admitting: Radiation Oncology

## 2017-08-24 ENCOUNTER — Telehealth: Payer: Self-pay | Admitting: Hematology and Oncology

## 2017-08-24 ENCOUNTER — Encounter: Payer: Self-pay | Admitting: Hematology and Oncology

## 2017-08-24 NOTE — Telephone Encounter (Signed)
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 Pt has been scheduled to see Dr. Lindi Adie on 6/25 at 1pm. Pt has agreed to the appt date and time. Letter mailed.

## 2017-08-28 ENCOUNTER — Ambulatory Visit: Payer: BLUE CROSS/BLUE SHIELD | Admitting: Hematology and Oncology

## 2017-08-28 ENCOUNTER — Ambulatory Visit: Payer: BLUE CROSS/BLUE SHIELD | Admitting: Radiation Oncology

## 2017-08-28 ENCOUNTER — Ambulatory Visit: Payer: BLUE CROSS/BLUE SHIELD

## 2017-09-10 DIAGNOSIS — H40053 Ocular hypertension, bilateral: Secondary | ICD-10-CM | POA: Diagnosis not present

## 2017-09-10 DIAGNOSIS — H5213 Myopia, bilateral: Secondary | ICD-10-CM | POA: Diagnosis not present

## 2017-09-12 DIAGNOSIS — D0592 Unspecified type of carcinoma in situ of left breast: Secondary | ICD-10-CM | POA: Diagnosis not present

## 2017-09-12 DIAGNOSIS — N63 Unspecified lump in unspecified breast: Secondary | ICD-10-CM | POA: Diagnosis not present

## 2017-09-26 DIAGNOSIS — N63 Unspecified lump in unspecified breast: Secondary | ICD-10-CM | POA: Diagnosis not present

## 2017-09-26 DIAGNOSIS — N6311 Unspecified lump in the right breast, upper outer quadrant: Secondary | ICD-10-CM | POA: Diagnosis not present

## 2017-09-26 DIAGNOSIS — D0512 Intraductal carcinoma in situ of left breast: Secondary | ICD-10-CM | POA: Diagnosis not present

## 2017-09-28 DIAGNOSIS — D0512 Intraductal carcinoma in situ of left breast: Secondary | ICD-10-CM | POA: Diagnosis not present

## 2017-09-28 DIAGNOSIS — Z170 Estrogen receptor positive status [ER+]: Secondary | ICD-10-CM | POA: Diagnosis not present

## 2017-10-08 ENCOUNTER — Other Ambulatory Visit: Payer: BLUE CROSS/BLUE SHIELD

## 2017-10-08 DIAGNOSIS — E559 Vitamin D deficiency, unspecified: Secondary | ICD-10-CM

## 2017-10-09 LAB — VITAMIN D 25 HYDROXY (VIT D DEFICIENCY, FRACTURES): Vit D, 25-Hydroxy: 30 ng/mL (ref 30–100)

## 2017-10-10 DIAGNOSIS — D0512 Intraductal carcinoma in situ of left breast: Secondary | ICD-10-CM | POA: Diagnosis not present

## 2017-10-16 DIAGNOSIS — H6123 Impacted cerumen, bilateral: Secondary | ICD-10-CM | POA: Diagnosis not present

## 2017-10-19 ENCOUNTER — Other Ambulatory Visit: Payer: Self-pay | Admitting: Physician Assistant

## 2017-10-19 ENCOUNTER — Telehealth: Payer: Self-pay

## 2017-10-19 MED ORDER — HYDROXYZINE HCL 25 MG PO TABS: 50.0000 mg | ORAL_TABLET | Freq: Three times a day (TID) | ORAL | 0 refills | Status: DC | PRN

## 2017-10-19 NOTE — Telephone Encounter (Signed)
Pt has been informed of MyChart message & Meds that have been sent into pharmacy. August 16th 2019 at 10:59am by DD

## 2017-11-01 DIAGNOSIS — L508 Other urticaria: Secondary | ICD-10-CM | POA: Diagnosis not present

## 2018-01-30 DIAGNOSIS — L820 Inflamed seborrheic keratosis: Secondary | ICD-10-CM | POA: Diagnosis not present

## 2018-01-30 DIAGNOSIS — D2272 Melanocytic nevi of left lower limb, including hip: Secondary | ICD-10-CM | POA: Diagnosis not present

## 2018-01-30 DIAGNOSIS — L570 Actinic keratosis: Secondary | ICD-10-CM | POA: Diagnosis not present

## 2018-01-30 DIAGNOSIS — D225 Melanocytic nevi of trunk: Secondary | ICD-10-CM | POA: Diagnosis not present

## 2018-01-30 DIAGNOSIS — L814 Other melanin hyperpigmentation: Secondary | ICD-10-CM | POA: Diagnosis not present

## 2018-01-30 DIAGNOSIS — D2371 Other benign neoplasm of skin of right lower limb, including hip: Secondary | ICD-10-CM | POA: Diagnosis not present

## 2018-02-11 DIAGNOSIS — M542 Cervicalgia: Secondary | ICD-10-CM | POA: Diagnosis not present

## 2018-02-12 DIAGNOSIS — H6123 Impacted cerumen, bilateral: Secondary | ICD-10-CM | POA: Diagnosis not present

## 2018-02-13 DIAGNOSIS — M542 Cervicalgia: Secondary | ICD-10-CM | POA: Diagnosis not present

## 2018-02-15 DIAGNOSIS — M542 Cervicalgia: Secondary | ICD-10-CM | POA: Diagnosis not present

## 2018-02-18 DIAGNOSIS — M542 Cervicalgia: Secondary | ICD-10-CM | POA: Diagnosis not present

## 2018-02-20 DIAGNOSIS — M542 Cervicalgia: Secondary | ICD-10-CM | POA: Diagnosis not present

## 2018-02-22 DIAGNOSIS — M542 Cervicalgia: Secondary | ICD-10-CM | POA: Diagnosis not present

## 2018-02-25 DIAGNOSIS — M542 Cervicalgia: Secondary | ICD-10-CM | POA: Diagnosis not present

## 2018-02-28 DIAGNOSIS — M542 Cervicalgia: Secondary | ICD-10-CM | POA: Diagnosis not present

## 2018-03-04 DIAGNOSIS — M542 Cervicalgia: Secondary | ICD-10-CM | POA: Diagnosis not present

## 2018-03-05 DIAGNOSIS — M542 Cervicalgia: Secondary | ICD-10-CM | POA: Diagnosis not present

## 2018-05-08 NOTE — Progress Notes (Signed)
Patient ID: Wanda Wilson, female   DOB: May 15, 1965, 53 y.o.   MRN: [id number] Complete Physical  Assessment and Plan:  Depression  will try wellbutrin and think about counseling again No SI/HI Discussed FMLA but wants to try medication, lifestyle change, exercise and meditation at this time.   Fatigue, unspecified type  try medication, lifestyle change, exercise and meditation at this time.  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  Seasonal affective disorder (HCC) Continue exercise, declines wellbutrin but did discuss it, wants to check vitamin D in summer   Rosacea Follows with derm  Arthritis Continue exercise  Routine general medical examination at a health care facility 1 year REMINDED TO GET MGM  Screening cholesterol level - Lipid panel  Screening for blood or protein in urine - Urinalysis, Routine w reflex microscopic (not at [facility]) - Microalbumin / creatinine urine ratio  Medication management - CBC with Differential/Platelet - BASIC METABOLIC PANEL WITH GFR - Hepatic function panel - Magnesium  Vitamin D deficiency -     VITAMIN D 25 Hydroxy (Vit-D Deficiency, Fractures); Future - check in august/sept  Screening for cardiovascular condition -     EKG 12-Lead  Seasonal affective disorder (HCC) Try wellbutrin  Screening for cervical cancer -     Herpes I and II, IgM- wrong name per [facility] in epic- PAP today   Discussed med's effects and SE's. Screening labs and tests as requested with regular follow-up as recommended.  HPI 53 y.o. female  presents for a complete physical.  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 States she will get 24 hour calls,   does not want to get out of bed, no motivation, decreased energy, no trouble with sleep. Did counseling last year through work, did 5 visits. She states she can not concentrate at work and is asking for [facility]  Her blood pressure has been controlled at home, today their BP is BP: 122/78  BP Readings from Last 3 Encounters:  05/09/18 122/78  08/10/17 122/64  05/08/17 (!) 96/58   She does workout, will walk her lab and working out with a trainer. She denies chest pain, shortness of breath, dizziness.  BF had MI at 48 may 2018.   Her cholesterol is at goal. The cholesterol last visit was:  Lab Results  Component Value Date   CHOL 196 05/08/2017   HDL 63 05/08/2017   LDLCALC 116 (H) 05/08/2017   TRIG 74 05/08/2017   CHOLHDL 3.1 05/08/2017     Lab Results  Component Value Date   HGBA1C 5.2 05/03/2015   She has Vitamin D def, she on a vitamin supplement sporadically.  Lab Results  Component Value Date   VD25OH 30 10/08/2017   Lab Results  Component Value Date   GFRNONAA 64 05/08/2017   Use to see dermatologist for eczema/dry skin.   BMI is Body mass index is 25.82 kg/m., she is working on diet and exercise. Wt Readings from Last 3 Encounters:  05/09/18 162 lb 6.4 oz (73.7 kg)  08/10/17 157 lb (71.2 kg)  05/08/17 161 lb (73  kg)    Current Medications:  Current Outpatient Medications on File Prior to Visit  Medication Sig  . acetaminophen (TYLENOL) 500 MG tablet Take 500 mg by mouth every 6 (six) hours as needed.  . hydrOXYzine (ATARAX/VISTARIL) 25 MG tablet Take 2 tablets (50 mg total) by mouth 3 (three) times daily as needed for itching.  . naproxen sodium (ANAPROX) 220 MG tablet Take 220 mg by mouth as needed.   No current facility-administered medications on file prior to visit.     Health Maintenance:  Immunization History  Administered Date(s) Administered  . Tdap 05/01/2012   TDAP: 2014 Pneumovax: N/A Prevnar: NA Flu vaccine: declines Zostavax:N/A  Pap:  2015 due 5 years, never abnormal pap- TODAY MGM: 06/2017 DEXA: N/A Colonoscopy:  07/12/2016 EGD: N/A Derm Path 2014  Dentist: Dr. Loran Senters Eye: Dr. Satira Sark  Medical History:  Past Medical History:  Diagnosis Date  . Arthritis    cervical neck, CT 2014  . Grover's disease   . Plantar fasciitis   . Rosacea 01/01/2009   Qualifier: Diagnosis of  By: Jerold Coombe    . Seasonal affective disorder ([place])   . Vitamin D deficiency    Allergies No Active Allergies  SURGICAL HISTORY She  has a past surgical history that includes Tonsillectomy and adenoidectomy; Hernia repair; Mandible surgery; and Breast lumpectomy with radioactive seed localization (Left, 08/10/2017). FAMILY HISTORY Her family history includes Arthritis in her sister; Asthma in her sister; Cancer in her father; Hypertension in her mother; Peptic Ulcer Disease in her father. SOCIAL HISTORY She  reports that she has never smoked. She has never used smokeless tobacco. She reports current alcohol use. She reports that she does not use drugs.   Review of Systems  Constitutional: Negative for chills, diaphoresis, fever, malaise/fatigue and weight loss.  HENT: Negative.   Eyes: Negative.   Respiratory: Negative.   Cardiovascular: Negative.   Gastrointestinal: Negative.   Genitourinary: Negative.  Negative for dysuria, flank pain, frequency, hematuria and urgency.       + vaginal dryness  Musculoskeletal: Negative for back pain, falls, joint pain, myalgias and neck pain.  Skin: Negative.   Neurological: Negative.  Negative for tingling, sensory change, focal weakness and weakness.  Endo/Heme/Allergies: Negative.   Psychiatric/Behavioral: Positive for depression. Negative for hallucinations, memory loss, substance abuse and suicidal ideas. The patient is not nervous/anxious and does not have insomnia.    Physical Exam: Estimated body mass index is 25.82 kg/m as calculated from the following:   Height as of this  encounter: 5' 6.5" (1.689 m).   Weight as of this encounter: 162 lb 6.4 oz (73.7 kg). Vitals:   05/09/18 0902  BP: 122/78  Pulse: 79  Temp: 98.2 F (36.8 C)  SpO2: 97%   General Appearance: Well nourished, in no apparent distress. Eyes: PERRLA, EOMs, conjunctiva no swelling or erythema, normal fundi and vessels. Sinuses: No Frontal/maxillary tenderness ENT/Mouth: Ext aud canals clear, normal light reflex with TMs without erythema, bulging.  Good dentition. No erythema, swelling, or exudate on post pharynx. Tonsils not swollen or erythematous. Hearing normal.  Neck: Supple, thyroid normal. No bruits Respiratory: Respiratory effort normal, BS equal bilaterally without rales, rhonchi, wheezing or stridor. Cardio: RRR without murmurs, rubs or gallops. Brisk peripheral pulses without edema.  Chest: symmetric, with normal excursions and percussion. Breasts: Symmetric, with some mobile, tender lumps bilateral breast, nipple discharge, retractions. Abdomen: Soft, +BS. Non tender, no guarding, rebound, hernias, masses, or organomegaly.  Lymphatics: Non tender without  lymphadenopathy.  Genitourinary: normal external genitalia, vulva, vagina, cervix, uterus and adnexa. Musculoskeletal: Full ROM all peripheral extremities,5/5 strength  and normal gait.  Skin: Warm, dry without  lesions, ecchymosis.  Neuro: Cranial nerves intact, reflexes equal bilaterally. Normal muscle tone, no cerebellar symptoms. Sensation intact.  Psych: Awake and oriented X 3, normal affect, Insight and Judgment appropriate.   EKG: WNL   Vicie Mutters 9:12 AM

## 2018-05-09 ENCOUNTER — Encounter: Payer: Self-pay | Admitting: Physician Assistant

## 2018-05-09 ENCOUNTER — Ambulatory Visit (INDEPENDENT_AMBULATORY_CARE_PROVIDER_SITE_OTHER): Payer: BLUE CROSS/BLUE SHIELD | Admitting: Physician Assistant

## 2018-05-09 VITALS — BP 122/78 | HR 79 | Temp 98.2°F | Ht 66.5 in | Wt 162.4 lb

## 2018-05-09 DIAGNOSIS — Z130 Encounter for screening for diseases of the blood and blood-forming organs and certain disorders involving the immune mechanism: Secondary | ICD-10-CM

## 2018-05-09 DIAGNOSIS — Z131 Encounter for screening for diabetes mellitus: Secondary | ICD-10-CM

## 2018-05-09 DIAGNOSIS — Z136 Encounter for screening for cardiovascular disorders: Secondary | ICD-10-CM

## 2018-05-09 DIAGNOSIS — Z1329 Encounter for screening for other suspected endocrine disorder: Secondary | ICD-10-CM | POA: Diagnosis not present

## 2018-05-09 DIAGNOSIS — I10 Essential (primary) hypertension: Secondary | ICD-10-CM

## 2018-05-09 DIAGNOSIS — M1990 Unspecified osteoarthritis, unspecified site: Secondary | ICD-10-CM

## 2018-05-09 DIAGNOSIS — Z0000 Encounter for general adult medical examination without abnormal findings: Secondary | ICD-10-CM

## 2018-05-09 DIAGNOSIS — R5383 Other fatigue: Secondary | ICD-10-CM

## 2018-05-09 DIAGNOSIS — Z113 Encounter for screening for infections with a predominantly sexual mode of transmission: Secondary | ICD-10-CM | POA: Diagnosis not present

## 2018-05-09 DIAGNOSIS — F321 Major depressive disorder, single episode, moderate: Secondary | ICD-10-CM

## 2018-05-09 DIAGNOSIS — E559 Vitamin D deficiency, unspecified: Secondary | ICD-10-CM | POA: Diagnosis not present

## 2018-05-09 DIAGNOSIS — Z13220 Encounter for screening for lipoid disorders: Secondary | ICD-10-CM

## 2018-05-09 DIAGNOSIS — L719 Rosacea, unspecified: Secondary | ICD-10-CM

## 2018-05-09 DIAGNOSIS — Z1389 Encounter for screening for other disorder: Secondary | ICD-10-CM | POA: Diagnosis not present

## 2018-05-09 DIAGNOSIS — L111 Transient acantholytic dermatosis [Grover]: Secondary | ICD-10-CM

## 2018-05-09 DIAGNOSIS — Z79899 Other long term (current) drug therapy: Secondary | ICD-10-CM | POA: Diagnosis not present

## 2018-05-09 DIAGNOSIS — F338 Other recurrent depressive disorders: Secondary | ICD-10-CM

## 2018-05-09 DIAGNOSIS — Z124 Encounter for screening for malignant neoplasm of cervix: Secondary | ICD-10-CM

## 2018-05-09 MED ORDER — BUPROPION HCL ER (XL) 150 MG PO TB24: 150.0000 mg | ORAL_TABLET | ORAL | 2 refills | Status: DC

## 2018-05-09 MED ORDER — FLUCONAZOLE 150 MG PO TABS: 150.0000 mg | ORAL_TABLET | Freq: Every day | ORAL | 3 refills | Status: DC

## 2018-05-09 NOTE — Patient Instructions (Addendum)
Will start you on wellbutrin to try to help with mood/weight loss If you get nausea and HA after the first week please stop If you get anxious or snappy with people than stop the medication But this medication can help with energy, weight loss and mood It kicks in about 1-2 weeks And can be stopped quickly  Counseling services  Here are some numbers below you can try but I suggest calling your insurance and finding out who is in your network and THEN calling those people or looking them up on google.   I'm a big fan of Cognitive Behavioral Therapy, look this up on You tube or check with the therapist you see if they are certified.  This form of therapy helps to teach you skills to better handle with current situation that are causing anxiety or depression.   Will do close follow up 4 weeks  Moody what a healthy weight is for you (roughly BMI <25) and aim to maintain this  Aim for 7+ servings of fruits and vegetables daily  70-80+ fluid ounces of water or unsweet tea for healthy kidneys  Limit to max 1 drink of alcohol per day; avoid smoking/tobacco  Limit animal fats in diet for cholesterol and heart health - choose grass fed whenever available  Avoid highly processed foods, and foods high in saturated/trans fats  Aim for low stress - take time to unwind and care for your mental health  Aim for 150 min of moderate intensity exercise weekly for heart health, and weights twice weekly for bone health  Aim for 7-9 hours of sleep daily   Take one diflucan pill to see if the discharge gets better If not can get boric acid (600mg ) suppositories from [employer] and use once nightly for 1 week and then more as needed up to 1-3 x a week to prevent reoccurence.  VAGINAL DRYNESS OVERVIEW  Vaginal dryness, also known as atrophic vaginitis, is a common condition in postmenopausal women. This condition is also common in women who have had both ovaries removed at the  time of hysterectomy.   Some women have uncomfortable symptoms of vaginal dryness, such as pain with sex, burning vaginal discomfort or itching, or abnormal vaginal discharge, while others have no symptoms at all.  VAGINAL DRYNESS CAUSES   Estrogen helps to keep the vagina moist and to maintain thickness of the vaginal lining. Vaginal dryness occurs when the ovaries produce a decreased amount of estrogen. This can occur at certain times in a woman's life, and may be permanent or temporary. Times when less estrogen is made include: ?At the time of menopause. ?After surgical removal of the ovaries, chemotherapy, or radiation therapy of the pelvis for cancer. ?After having a baby, particularly in women who breastfeed. ?While using certain medications, such as danazol, medroxyprogesterone (brand names: Provera or DepoProvera), leuprolide (brand name: Lupron), or nafarelin. When these medications are stopped, estrogen production resumes.  Women who smoke cigarettes have been shown to have an increased risk of an earlier menopause transition as compared to non-smokers. Therefore, atrophic vaginitis symptoms may appear at a younger age in this population.  VAGINAL DRYNESS TREATMENT   There are three treatment options for women with vaginal dryness:  Vaginal lubricants and moisturizers - Vaginal lubricants and moisturizers can be purchased without a prescription. These products do not contain any hormones and have virtually no side effects. - Albolene is found in the facial cleanser section at [facility], [facility], or [employer]. It  is a large jar with a blue top. This is the best lubricant for women because it is hypoallergenic. -Natural lubricants, such as olive, avocado or peanut oil, are easily available products that may be used as a lubricant with sex.  -Vaginal moisturizes (eg, Replens, Moist Again, Vagisil, K-Y Silk-E, and Feminease) are formulated to allow water to be retained in the vaginal tissues.  Moisturizers are applied into the vagina three times weekly to allow a continued moisturizing effect. These should not be used just before having sex, as they can be irritating.  Vaginal estrogen - Vaginal estrogen is the most effective treatment option for women with vaginal dryness. Vaginal estrogen must be prescribed by a healthcare provider. Very low doses of vaginal estrogen can be used when it is put into the vagina to treat vaginal dryness. A small amount of estrogen is absorbed into the bloodstream, but only about 100 times less than when using estrogen pills or tablets. As a result, there is a much lower risk of side effects, such as blood clots, breast cancer, and heart attack, compared with other estrogen-containing products (birth control pills, menopausal hormone therapy).   Ospemifene - Ospemifene is a prescription medication that is similar to estrogen, but is not estrogen. In the vaginal tissue, it acts similarly to estrogen. In the breast tissue, it acts as an estrogen blocker. It comes in a pill, and is prescribed for women who want to use an estrogen-like medication for vaginal dryness or painful sex associated with vaginal dryness, but prefer not to use a vaginal medication. The medication may cause hot flashes as a side effect. This type of medication may increase the risk of blood clots or uterine cancer. Further study of ospemifene is needed to evaluate the risk of these complications. This medication has not been tested in women who have had breast cancer or are at a high risk of developing breast cancer.    Sexual activity - Vaginal estrogen improves vaginal dryness quickly, usually within a few weeks. You may continue to have sex as you treat vaginal dryness because sex itself can help to keep the vaginal tissues healthy. Vaginal intercourse may help the vaginal tissues by keeping them soft and stretchable and preventing the tissues from shrinking.  If sex continues to be painful  despite treatment for vaginal dryness, talk to your healthcare provider.

## 2018-05-10 LAB — LIPID PANEL
Cholesterol: 188 mg/dL (ref <200)
HDL: 58 mg/dL (ref >=50)
LDL CHOLESTEROL (CALC): 114 mg/dL — AB
NON-HDL CHOLESTEROL (CALC): 130 mg/dL — AB (ref <130)
TRIGLYCERIDES: 67 mg/dL (ref <150)
Total CHOL/HDL Ratio: 3.2 (calc) (ref <5.0)

## 2018-05-10 LAB — URINALYSIS, ROUTINE W REFLEX MICROSCOPIC
Bilirubin Urine: NEGATIVE
GLUCOSE, UA: NEGATIVE
Hgb urine dipstick: NEGATIVE
Ketones, ur: NEGATIVE
LEUKOCYTE UA: NEGATIVE
Nitrite: NEGATIVE
PH: 6 (ref 5.0–8.0)
PROTEIN: NEGATIVE
SPECIFIC GRAVITY, URINE: 1.022 (ref 1.001–1.03)

## 2018-05-10 LAB — IRON, TOTAL/TOTAL IRON BINDING CAP
%SAT: 51 % — AB (ref 16–45)
IRON: 150 ug/dL (ref 45–160)
TIBC: 296 ug/dL (ref 250–450)

## 2018-05-10 LAB — PAP, TP IMAGING W/ HPV RNA, RFLX HPV TYPE 16,18/45: HPV DNA HIGH RISK: NOT DETECTED

## 2018-05-10 LAB — COMPLETE METABOLIC PANEL WITH GFR
AG Ratio: 1.6 (calc) (ref 1.0–2.5)
ALBUMIN MSPROF: 4.4 g/dL (ref 3.6–5.1)
ALT: 23 U/L (ref 6–29)
AST: 17 U/L (ref 10–35)
Alkaline phosphatase (APISO): 54 U/L (ref 37–153)
BILIRUBIN TOTAL: 0.7 mg/dL (ref 0.2–1.2)
BUN: 12 mg/dL (ref 7–25)
CO2: 28 mmol/L (ref 20–32)
CREATININE: 0.86 mg/dL (ref 0.50–1.05)
Calcium: 9.4 mg/dL (ref 8.6–10.4)
Chloride: 104 mmol/L (ref 98–110)
GFR, EST AFRICAN AMERICAN: 89 mL/min/{1.73_m2} (ref >=60)
GFR, Est Non African American: 77 mL/min/{1.73_m2} (ref >=60)
GLOBULIN: 2.7 g/dL (ref 1.9–3.7)
Glucose, Bld: 86 mg/dL (ref 65–99)
Potassium: 4.2 mmol/L (ref 3.5–5.3)
SODIUM: 137 mmol/L (ref 135–146)
TOTAL PROTEIN: 7.1 g/dL (ref 6.1–8.1)

## 2018-05-10 LAB — HEMOGLOBIN A1C
HEMOGLOBIN A1C: 5.2 %{Hb} (ref <5.7)
Mean Plasma Glucose: 103 (calc)
eAG (mmol/L): 5.7 (calc)

## 2018-05-10 LAB — CBC WITH DIFFERENTIAL/PLATELET
ABSOLUTE MONOCYTES: 568 {cells}/uL (ref 200–950)
BASOS ABS: 17 {cells}/uL (ref 0–200)
Basophils Relative: 0.3 %
EOS ABS: 99 {cells}/uL (ref 15–500)
Eosinophils Relative: 1.7 %
HEMATOCRIT: 40.8 % (ref 35.0–45.0)
Hemoglobin: 14.1 g/dL (ref 11.7–15.5)
LYMPHS ABS: 1433 {cells}/uL (ref 850–3900)
MCH: 31.5 pg (ref 27.0–33.0)
MCHC: 34.6 g/dL (ref 32.0–36.0)
MCV: 91.3 fL (ref 80.0–100.0)
MPV: 10 fL (ref 7.5–12.5)
Monocytes Relative: 9.8 %
NEUTROS PCT: 63.5 %
Neutro Abs: 3683 cells/uL (ref 1500–7800)
Platelets: 259 10*3/uL (ref 140–400)
RBC: 4.47 10*6/uL (ref 3.80–5.10)
RDW: 12.2 % (ref 11.0–15.0)
Total Lymphocyte: 24.7 %
WBC: 5.8 10*3/uL (ref 3.8–10.8)

## 2018-05-10 LAB — TSH: TSH: 2.06 mIU/L

## 2018-05-10 LAB — VITAMIN D 25 HYDROXY (VIT D DEFICIENCY, FRACTURES): Vit D, 25-Hydroxy: 25 ng/mL — ABNORMAL LOW (ref 30–100)

## 2018-05-10 LAB — MICROALBUMIN / CREATININE URINE RATIO
Creatinine, Urine: 169 mg/dL (ref 20–275)
Microalb Creat Ratio: 3 mcg/mg creat (ref <30)
Microalb, Ur: 0.5 mg/dL

## 2018-05-10 LAB — VITAMIN B12: VITAMIN B 12: 390 pg/mL (ref 200–1100)

## 2018-05-10 LAB — MAGNESIUM: Magnesium: 1.9 mg/dL (ref 1.5–2.5)

## 2018-05-10 LAB — PAP, TP IMAGING, WNL RFLX HPV

## 2018-05-10 LAB — FERRITIN: Ferritin: 72 ng/mL (ref 16–232)

## 2018-07-24 DIAGNOSIS — N6321 Unspecified lump in the left breast, upper outer quadrant: Secondary | ICD-10-CM | POA: Diagnosis not present

## 2018-07-24 DIAGNOSIS — D0512 Intraductal carcinoma in situ of left breast: Secondary | ICD-10-CM | POA: Diagnosis not present

## 2018-07-26 DIAGNOSIS — H6123 Impacted cerumen, bilateral: Secondary | ICD-10-CM | POA: Diagnosis not present

## 2018-09-13 DIAGNOSIS — H5213 Myopia, bilateral: Secondary | ICD-10-CM | POA: Diagnosis not present

## 2018-09-13 DIAGNOSIS — H40053 Ocular hypertension, bilateral: Secondary | ICD-10-CM | POA: Diagnosis not present

## 2018-09-13 DIAGNOSIS — H40013 Open angle with borderline findings, low risk, bilateral: Secondary | ICD-10-CM | POA: Diagnosis not present

## 2018-09-13 DIAGNOSIS — H52203 Unspecified astigmatism, bilateral: Secondary | ICD-10-CM | POA: Diagnosis not present

## 2019-05-15 ENCOUNTER — Encounter: Payer: Self-pay | Admitting: Physician Assistant

## 2019-05-21 NOTE — Progress Notes (Signed)
Patient ID: Wanda Wilson, female   DOB: May 21, 1965, 54 y.o.   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  Seasonal affective disorder (HCC) Continue exercise, and monitor   Rosacea Follows with derm  Arthritis Continue exercise  Routine general medical examination at a health care facility 1 year  Screening cholesterol level - Lipid panel  Screening for blood or protein in urine - Urinalysis, Routine w reflex microscopic (not at [facility]) - Microalbumin / creatinine urine ratio  Medication management - CBC with Differential/Platelet - BASIC METABOLIC PANEL WITH GFR - Hepatic function panel - Magnesium  Vitamin D deficiency -     VITAMIN D 25 Hydroxy (Vit-D Deficiency, Fractures); Future - check in august/sept  Screening for cardiovascular condition -   Declines this year   Discussed med's effects and SE's. Screening labs and tests as requested with regular follow-up as recommended.  HPI 54 y.o. female  presents for a complete physical.  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 She was furloughed and then terminated July 2020, has been on unemployment. Married August 12th to long time BF, x 2010.   Her blood pressure has been controlled at home, today their BP is BP: 118/70  BP Readings from Last 3 Encounters:  05/26/19 118/70  05/09/18 122/78  08/10/17 122/64   She does workout, will walk her lab and working out with a trainer. She denies chest pain, shortness of breath, dizziness.    Her cholesterol is at goal. The cholesterol last visit was:  Lab Results  Component Value Date   CHOL 188 05/09/2018   HDL 58 05/09/2018   LDLCALC 114 (H) 05/09/2018   TRIG 67 05/09/2018   CHOLHDL 3.2 05/09/2018      Lab Results  Component Value Date   HGBA1C 5.2 05/09/2018   She has Vitamin D def, she on a vitamin supplement sporadically.  Lab Results  Component Value Date   VD25OH 25 (L) 05/09/2018   Lab Results  Component Value Date   GFRNONAA 77 05/09/2018   Use to see dermatologist for eczema/dry skin.    Current Medications:  Current Outpatient Medications on File Prior to Visit  Medication Sig  . acetaminophen (TYLENOL) 500 MG tablet Take 500 mg by mouth every 6 (six) hours as needed.  . naproxen sodium (ANAPROX) 220 MG tablet Take 220 mg by mouth as needed.   No current facility-administered medications on file prior to visit.    Health Maintenance:  Immunization History  Administered Date(s) Administered  . Tdap 05/01/2012   TDAP: 2014 Pneumovax: N/A Prevnar: NA Flu vaccine: declines Zostavax:N/A  Pap: 05/2018 due 5 years, never abnormal pap MGM:  07/2018 diagnostic MGM bilateral at [place] for DCIS DEXA: N/A Colonoscopy:  07/12/2016 EGD: N/A Derm Path 2014  Medical History:  Past Medical History:  Diagnosis Date  . Arthritis    cervical neck, CT 2014  . Grover's disease   . Plantar fasciitis   . Rosacea 01/01/2009   Qualifier: Diagnosis of  By: Jerold Coombe    . Seasonal affective disorder ([place])   . Vitamin D deficiency    Allergies No Active Allergies  SURGICAL HISTORY She  has a past surgical history that includes Tonsillectomy  and adenoidectomy; Hernia repair; Mandible surgery; and Breast lumpectomy with radioactive seed localization (Left, 08/10/2017). FAMILY HISTORY Her family history includes Arthritis in her sister; Asthma in her sister; Cancer in her father; Hypertension in her mother; Peptic Ulcer Disease in her father. SOCIAL HISTORY She  reports that she has never smoked. She has never used smokeless tobacco. She reports current alcohol use. She reports that she does not use drugs.   Review of Systems  Constitutional: Negative for  chills, diaphoresis, fever, malaise/fatigue and weight loss.  HENT: Negative.   Eyes: Negative.   Respiratory: Negative.   Cardiovascular: Negative.   Gastrointestinal: Negative.   Genitourinary: Negative.  Negative for dysuria, flank pain, frequency, hematuria and urgency.       + vaginal dryness  Musculoskeletal: Negative for back pain, falls, joint pain, myalgias and neck pain.  Skin: Negative.   Neurological: Negative.  Negative for tingling, sensory change, focal weakness and weakness.  Endo/Heme/Allergies: Negative.   Psychiatric/Behavioral: Positive for depression. Negative for hallucinations, memory loss, substance abuse and suicidal ideas. The patient is not nervous/anxious and does not have insomnia.    Physical Exam: Estimated body mass index is 26.95 kg/m as calculated from the following:   Height as of this encounter: 5\' 6"  (1.676 m).   Weight as of this encounter: 167 lb (75.8 kg). Vitals:   05/26/19 1045  BP: 118/70  Pulse: 67  Temp: 97.7 F (36.5 C)  SpO2: 98%   General Appearance: Well nourished, in no apparent distress. Eyes: PERRLA, EOMs, conjunctiva no swelling or erythema, normal fundi and vessels. Sinuses: No Frontal/maxillary tenderness ENT/Mouth: Ext aud canals clear, normal light reflex with TMs without erythema, bulging.  Good dentition. No erythema, swelling, or exudate on post pharynx. Tonsils not swollen or erythematous. Hearing normal.  Neck: Supple, thyroid normal. No bruits Respiratory: Respiratory effort normal, BS equal bilaterally without rales, rhonchi, wheezing or stridor. Cardio: RRR without murmurs, rubs or gallops. Brisk peripheral pulses without edema.  Chest: symmetric, with normal excursions and percussion. Breasts: Symmetric, with some mobile, tender lumps bilateral breast, nipple discharge, retractions. Abdomen: Soft, +BS. Non tender, no guarding, rebound, hernias, masses, or organomegaly.  Lymphatics: Non tender without  lymphadenopathy.  Genitourinary: normal external genitalia, vulva, vagina, cervix, uterus and adnexa. Musculoskeletal: Full ROM all peripheral extremities,5/5 strength  and normal gait.  Skin: Warm, dry without  lesions, ecchymosis.  Neuro: Cranial nerves intact, reflexes equal bilaterally. Normal muscle tone, no cerebellar symptoms. Sensation intact.  Psych: Awake and oriented X 3, normal affect, Insight and Judgment appropriate.   EKG: WNL   Vicie Mutters 10:58 AM

## 2019-05-26 ENCOUNTER — Encounter: Payer: Self-pay | Admitting: Physician Assistant

## 2019-05-26 ENCOUNTER — Other Ambulatory Visit: Payer: Self-pay

## 2019-05-26 ENCOUNTER — Ambulatory Visit (INDEPENDENT_AMBULATORY_CARE_PROVIDER_SITE_OTHER): Payer: BC Managed Care – PPO | Admitting: Physician Assistant

## 2019-05-26 VITALS — BP 118/70 | HR 67 | Temp 97.7°F | Ht 66.0 in | Wt 167.0 lb

## 2019-05-26 DIAGNOSIS — Z1389 Encounter for screening for other disorder: Secondary | ICD-10-CM | POA: Diagnosis not present

## 2019-05-26 DIAGNOSIS — Z13220 Encounter for screening for lipoid disorders: Secondary | ICD-10-CM | POA: Diagnosis not present

## 2019-05-26 DIAGNOSIS — Z131 Encounter for screening for diabetes mellitus: Secondary | ICD-10-CM

## 2019-05-26 DIAGNOSIS — Z1329 Encounter for screening for other suspected endocrine disorder: Secondary | ICD-10-CM | POA: Diagnosis not present

## 2019-05-26 DIAGNOSIS — E559 Vitamin D deficiency, unspecified: Secondary | ICD-10-CM | POA: Diagnosis not present

## 2019-05-26 DIAGNOSIS — L719 Rosacea, unspecified: Secondary | ICD-10-CM

## 2019-05-26 DIAGNOSIS — M1990 Unspecified osteoarthritis, unspecified site: Secondary | ICD-10-CM

## 2019-05-26 DIAGNOSIS — Z0000 Encounter for general adult medical examination without abnormal findings: Secondary | ICD-10-CM

## 2019-05-26 DIAGNOSIS — Z0001 Encounter for general adult medical examination with abnormal findings: Secondary | ICD-10-CM

## 2019-05-26 DIAGNOSIS — F338 Other recurrent depressive disorders: Secondary | ICD-10-CM

## 2019-05-26 DIAGNOSIS — L111 Transient acantholytic dermatosis [Grover]: Secondary | ICD-10-CM

## 2019-05-26 DIAGNOSIS — Z79899 Other long term (current) drug therapy: Secondary | ICD-10-CM

## 2019-05-26 NOTE — Patient Instructions (Signed)
Know what a healthy weight is for you (roughly BMI <25) and aim to maintain this  Aim for 7+ servings of fruits and vegetables daily  65-80+ fluid ounces of water or unsweet tea for healthy kidneys  Limit to max 1 drink of alcohol per day; avoid smoking/tobacco  Limit animal fats in diet for cholesterol and heart health - choose grass fed whenever available  Avoid highly processed foods, and foods high in saturated/trans fats  Aim for low stress - take time to unwind and care for your mental health  Aim for 150 min of moderate intensity exercise weekly for heart health, and weights twice weekly for bone health  Aim for 7-9 hours of sleep daily    

## 2019-05-27 DIAGNOSIS — H6123 Impacted cerumen, bilateral: Secondary | ICD-10-CM | POA: Diagnosis not present

## 2019-05-27 LAB — CBC WITH DIFFERENTIAL/PLATELET
Absolute Monocytes: 633 cells/uL (ref 200–950)
Basophils Absolute: 40 cells/uL (ref 0–200)
Basophils Relative: 0.7 %
Eosinophils Absolute: 171 cells/uL (ref 15–500)
Eosinophils Relative: 3 %
HCT: 45.1 % — ABNORMAL HIGH (ref 35.0–45.0)
Hemoglobin: 14.7 g/dL (ref 11.7–15.5)
Lymphs Abs: 1835 cells/uL (ref 850–3900)
MCH: 30.9 pg (ref 27.0–33.0)
MCHC: 32.6 g/dL (ref 32.0–36.0)
MCV: 94.9 fL (ref 80.0–100.0)
MPV: 10 fL (ref 7.5–12.5)
Monocytes Relative: 11.1 %
Neutro Abs: 3021 cells/uL (ref 1500–7800)
Neutrophils Relative %: 53 %
Platelets: 264 10*3/uL (ref 140–400)
RBC: 4.75 10*6/uL (ref 3.80–5.10)
RDW: 12 % (ref 11.0–15.0)
Total Lymphocyte: 32.2 %
WBC: 5.7 10*3/uL (ref 3.8–10.8)

## 2019-05-27 LAB — HEMOGLOBIN A1C
Hgb A1c MFr Bld: 5.2 % of total Hgb (ref <5.7)
Mean Plasma Glucose: 103 (calc)
eAG (mmol/L): 5.7 (calc)

## 2019-05-27 LAB — URINALYSIS, ROUTINE W REFLEX MICROSCOPIC
Bilirubin Urine: NEGATIVE
Glucose, UA: NEGATIVE
Hgb urine dipstick: NEGATIVE
Ketones, ur: NEGATIVE
Leukocytes,Ua: NEGATIVE
Nitrite: NEGATIVE
Protein, ur: NEGATIVE
Specific Gravity, Urine: 1.026 (ref 1.001–1.03)
pH: <=5 (ref 5.0–8.0)

## 2019-05-27 LAB — LIPID PANEL
Cholesterol: 219 mg/dL — ABNORMAL HIGH (ref <200)
HDL: 59 mg/dL (ref >=50)
LDL Cholesterol (Calc): 140 mg/dL (calc) — ABNORMAL HIGH
Non-HDL Cholesterol (Calc): 160 mg/dL (calc) — ABNORMAL HIGH (ref <130)
Total CHOL/HDL Ratio: 3.7 (calc) (ref <5.0)
Triglycerides: 90 mg/dL (ref <150)

## 2019-05-27 LAB — MICROALBUMIN / CREATININE URINE RATIO
Creatinine, Urine: 166 mg/dL (ref 20–275)
Microalb Creat Ratio: 4 mcg/mg creat (ref <30)
Microalb, Ur: 0.6 mg/dL

## 2019-05-27 LAB — COMPLETE METABOLIC PANEL WITH GFR
AG Ratio: 1.7 (calc) (ref 1.0–2.5)
ALT: 31 U/L — ABNORMAL HIGH (ref 6–29)
AST: 17 U/L (ref 10–35)
Albumin: 4.6 g/dL (ref 3.6–5.1)
Alkaline phosphatase (APISO): 63 U/L (ref 37–153)
BUN: 17 mg/dL (ref 7–25)
CO2: 28 mmol/L (ref 20–32)
Calcium: 9.6 mg/dL (ref 8.6–10.4)
Chloride: 103 mmol/L (ref 98–110)
Creat: 0.86 mg/dL (ref 0.50–1.05)
GFR, Est African American: 88 mL/min/{1.73_m2} (ref >=60)
GFR, Est Non African American: 76 mL/min/{1.73_m2} (ref >=60)
Globulin: 2.7 g/dL (calc) (ref 1.9–3.7)
Glucose, Bld: 100 mg/dL — ABNORMAL HIGH (ref 65–99)
Potassium: 4.4 mmol/L (ref 3.5–5.3)
Sodium: 139 mmol/L (ref 135–146)
Total Bilirubin: 0.6 mg/dL (ref 0.2–1.2)
Total Protein: 7.3 g/dL (ref 6.1–8.1)

## 2019-05-27 LAB — MAGNESIUM: Magnesium: 2.1 mg/dL (ref 1.5–2.5)

## 2019-05-27 LAB — TSH: TSH: 2.09 mIU/L

## 2019-05-27 LAB — VITAMIN D 25 HYDROXY (VIT D DEFICIENCY, FRACTURES): Vit D, 25-Hydroxy: 22 ng/mL — ABNORMAL LOW (ref 30–100)

## 2019-08-18 DIAGNOSIS — L718 Other rosacea: Secondary | ICD-10-CM | POA: Diagnosis not present

## 2019-08-18 DIAGNOSIS — B078 Other viral warts: Secondary | ICD-10-CM | POA: Diagnosis not present

## 2019-08-18 DIAGNOSIS — L918 Other hypertrophic disorders of the skin: Secondary | ICD-10-CM | POA: Diagnosis not present

## 2019-08-27 DIAGNOSIS — D0512 Intraductal carcinoma in situ of left breast: Secondary | ICD-10-CM | POA: Diagnosis not present

## 2019-08-27 DIAGNOSIS — Z86000 Personal history of in-situ neoplasm of breast: Secondary | ICD-10-CM | POA: Diagnosis not present

## 2019-09-03 DIAGNOSIS — H6123 Impacted cerumen, bilateral: Secondary | ICD-10-CM | POA: Diagnosis not present

## 2019-12-04 DIAGNOSIS — H6123 Impacted cerumen, bilateral: Secondary | ICD-10-CM | POA: Diagnosis not present

## 2020-03-03 DIAGNOSIS — H6123 Impacted cerumen, bilateral: Secondary | ICD-10-CM | POA: Diagnosis not present

## 2020-03-04 DIAGNOSIS — H40053 Ocular hypertension, bilateral: Secondary | ICD-10-CM | POA: Diagnosis not present

## 2020-03-04 DIAGNOSIS — H5213 Myopia, bilateral: Secondary | ICD-10-CM | POA: Diagnosis not present

## 2020-05-14 ENCOUNTER — Encounter: Payer: BLUE CROSS/BLUE SHIELD | Admitting: Physician Assistant

## 2020-05-27 ENCOUNTER — Encounter: Payer: BLUE CROSS/BLUE SHIELD | Admitting: Adult Health Nurse Practitioner

## 2020-06-16 DIAGNOSIS — H6123 Impacted cerumen, bilateral: Secondary | ICD-10-CM | POA: Diagnosis not present

## 2020-07-02 DIAGNOSIS — Z86000 Personal history of in-situ neoplasm of breast: Secondary | ICD-10-CM | POA: Insufficient documentation

## 2020-07-02 DIAGNOSIS — E785 Hyperlipidemia, unspecified: Secondary | ICD-10-CM | POA: Insufficient documentation

## 2020-07-02 NOTE — Progress Notes (Signed)
Patient ID: Wanda Wilson, female   DOB: [birth year], 55 y.o.   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  Check lipid panel.  - Lipid panel - TSH  Screening for blood or protein in urine - Urinalysis, Routine w reflex microscopic (not at [facility])  Medication management - CBC with Differential/Platelet - CMP/GFR  Vitamin D deficiency -     VITAMIN D 25 Hydroxy (Vit-D Deficiency, Fractures); Future - declines   Screening for cardiovascular condition -   Declines this year   Orders Placed This Encounter  Procedures  . CBC with Differential/Platelet  . COMPLETE METABOLIC PANEL WITH GFR  . Lipid panel  . TSH  . Urinalysis, Routine w reflex microscopic     Discussed med's effects and SE's. Screening labs and tests as requested with regular follow-up as recommended.  Future Appointments  Date Time Provider [place]  07/05/2021  9:00 AM Liane Comber, NP [facility] None     HPI 55 y.o. female  presents for a complete physical. She has Rosacea; Vitamin D deficiency; Grover's disease; Seasonal affective disorder ([place]); Arthritis; Hyperlipidemia; and History of ductal carcinoma in situ (DCIS) of breast on their problem list.  Married in August 2020 to long time boyfriend, no kids.  She lost job during covid 19, continues to stay at home.   Hx of depression/seasonal affective disorder, just poor energy and motivation during the winter, never wanted to do wellbutrin, doing better since not at former job.   She has DCIS of L breast in 2019, s/p lumpectomy, followed by [facility], last mammogram 08/27/2019 showed  stable findings., will continue 5 years of diagnostic.   She has followed with derm for eczema/rosacea/Grover's disease in the past.   BMI is Body mass index is 28.3 kg/m., she has been working on diet, generally healthy but admits to sweets at night, needs to stop.  Goes to the gym 1-3 days a week. Walks her dog daily.  Drinks coffee, less soda. Sleeps ok if watches caffeine.  Wt Readings from Last 3 Encounters:  07/05/20 171 lb 6.4 oz (77.7 kg)  05/26/19 167 lb (75.8 kg)  05/09/18 162 lb 6.4 oz (73.7 kg)   Her blood pressure has been controlled at home, today their BP is BP: 124/80  She does workout, will walk her lab and working out with a trainer. She denies chest pain, shortness of breath, dizziness.    Her cholesterol is not at goal of LDL <100, atypically elevated last visit.  The cholesterol last visit was:  Lab Results  Component Value Date   CHOL 219 (H) 05/26/2019   HDL 59 05/26/2019   LDLCALC 140 (H) 05/26/2019   TRIG 90 05/26/2019   CHOLHDL 3.7 05/26/2019     Lab Results  Component Value Date   HGBA1C 5.2 05/26/2019   She is in CKD II range. Last GFR:  Lab Results  Component Value Date   GFRNONAA 76 05/26/2019   She has Vitamin D def, she admits not on vitamin D, not covered by insurance, declines  Lab Results  Component Value Date   VD25OH 22 (L) 05/26/2019    She is not currently on a  supplement Lab Results  Component Value Date   [id number] 193 05/09/2018     Current Medications:  Current Outpatient Medications on File Prior to Visit  Medication Sig  . acetaminophen (TYLENOL) 500 MG tablet Take 500 mg by mouth every 6 (six) hours as needed.  . naproxen sodium (ANAPROX) 220 MG tablet Take 220 mg by mouth as needed.   No current facility-administered medications on file prior to visit.    Health Maintenance:  Immunization History  Administered Date(s) Administered  . Tdap 05/01/2012   TDAP: 2014 Pneumovax: N/A Prevnar: NA Flu vaccine:  declines Shingrix: declines  Covid 19: declines   Pap: 05/2018, getting here, due 5 years, never abnormal pap MGM:  08/27/2019 diagnostic MGM bilateral at [facility] for DCIS DEXA: N/A  Colonoscopy:  07/12/2016, normal, 10 years, Dr. Silverio Decamp  EGD: N/A  Last eye: 02/2020, glasses, Dr. Delman Cheadle Last dental: 01/2020, has scheduled this month   Medical History:  Past Medical History:  Diagnosis Date  . Arthritis    cervical neck, CT 2014  . Grover's disease   . Plantar fasciitis   . Rosacea 01/01/2009   Qualifier: Diagnosis of  By: Jerold Coombe    . Seasonal affective disorder ([place])   . Vitamin D deficiency    Allergies No Active Allergies  SURGICAL HISTORY She  has a past surgical history that includes Tonsillectomy and adenoidectomy; Hernia repair (1979); Mandible surgery; and Breast lumpectomy with radioactive seed localization (Left, 08/10/2017). FAMILY HISTORY Her family history includes Arthritis in her sister; Asthma in her sister; Cancer in her father; Hypertension in her mother; Peptic Ulcer Disease in her father. SOCIAL HISTORY She  reports that she has never smoked. She has never used smokeless tobacco. She reports current alcohol use. She reports that she does not use drugs.   Review of Systems  Constitutional: Negative for malaise/fatigue and weight loss.  HENT: Negative for hearing loss and tinnitus.   Eyes: Negative for blurred vision and double vision.  Respiratory: Negative for cough, shortness of breath and wheezing.   Cardiovascular: Negative for chest pain, palpitations, orthopnea, claudication and leg swelling.  Gastrointestinal: Negative for abdominal pain, blood in stool, constipation, diarrhea, heartburn, melena, nausea and vomiting.  Genitourinary: Negative.   Musculoskeletal: Negative for joint pain and myalgias.  Skin: Negative for rash.  Neurological: Negative for dizziness, tingling, sensory change, weakness and headaches.  Endo/Heme/Allergies:  Negative for polydipsia.  Psychiatric/Behavioral: Positive for depression (improved). Negative for substance abuse and suicidal ideas. The patient is not nervous/anxious and does not have insomnia.   All other systems reviewed and are negative.  Physical Exam: Estimated body mass index is 28.3 kg/m as calculated from the following:   Height as of this encounter: 5' 5.25" (1.657 m).   Weight as of this encounter: 171 lb 6.4 oz (77.7 kg). Vitals:   07/05/20 0905  BP: 124/80  Pulse: 70  Temp: 97.7 F (36.5 C)  SpO2: 96%   General Appearance: Well nourished, in no apparent distress. Eyes: PERRLA, EOMs, conjunctiva no swelling or erythema Sinuses: No Frontal/maxillary tenderness ENT/Mouth: Ext aud canals clear, but very small and curved canals, follows with ENT, TMs not visualized.  Good dentition. No erythema, swelling, or exudate on post pharynx. Tonsils not swollen or erythematous. Hearing normal.  Neck: Supple, thyroid normal. No bruits Respiratory: Respiratory effort normal, BS equal bilaterally without rales, rhonchi, wheezing or stridor. Cardio: RRR without murmurs, rubs or gallops. Brisk peripheral pulses without edema.  Chest: symmetric, with normal  excursions and percussion. Breasts: Declines, [facility] following, does regular self checks Abdomen: Soft, +BS. Non tender, no guarding, rebound, hernias, masses, or organomegaly.  Lymphatics: Non tender without lymphadenopathy.  Genitourinary: defer  Musculoskeletal: Full ROM all peripheral extremities,5/5 strength  and normal gait.  Skin: Warm, dry without  lesions, ecchymosis.  Neuro: Cranial nerves intact, reflexes equal bilaterally. Normal muscle tone, no cerebellar symptoms. Sensation intact.  Psych: Awake and oriented X 3, normal affect, Insight and Judgment appropriate.   EKG: WNL 2020   Izora Ribas 9:48 AM

## 2020-07-05 ENCOUNTER — Ambulatory Visit (INDEPENDENT_AMBULATORY_CARE_PROVIDER_SITE_OTHER): Payer: BC Managed Care – PPO | Admitting: Adult Health

## 2020-07-05 ENCOUNTER — Other Ambulatory Visit: Payer: Self-pay

## 2020-07-05 ENCOUNTER — Encounter: Payer: Self-pay | Admitting: Adult Health

## 2020-07-05 VITALS — BP 124/80 | HR 70 | Temp 97.7°F | Ht 65.25 in | Wt 171.4 lb

## 2020-07-05 DIAGNOSIS — E785 Hyperlipidemia, unspecified: Secondary | ICD-10-CM

## 2020-07-05 DIAGNOSIS — Z1329 Encounter for screening for other suspected endocrine disorder: Secondary | ICD-10-CM | POA: Diagnosis not present

## 2020-07-05 DIAGNOSIS — Z6826 Body mass index (BMI) 26.0-26.9, adult: Secondary | ICD-10-CM

## 2020-07-05 DIAGNOSIS — Z1389 Encounter for screening for other disorder: Secondary | ICD-10-CM

## 2020-07-05 DIAGNOSIS — L111 Transient acantholytic dermatosis [Grover]: Secondary | ICD-10-CM

## 2020-07-05 DIAGNOSIS — Z13220 Encounter for screening for lipoid disorders: Secondary | ICD-10-CM

## 2020-07-05 DIAGNOSIS — F338 Other recurrent depressive disorders: Secondary | ICD-10-CM

## 2020-07-05 DIAGNOSIS — Z86000 Personal history of in-situ neoplasm of breast: Secondary | ICD-10-CM

## 2020-07-05 DIAGNOSIS — Z0000 Encounter for general adult medical examination without abnormal findings: Secondary | ICD-10-CM

## 2020-07-05 DIAGNOSIS — Z79899 Other long term (current) drug therapy: Secondary | ICD-10-CM

## 2020-07-05 DIAGNOSIS — R8290 Unspecified abnormal findings in urine: Secondary | ICD-10-CM

## 2020-07-05 DIAGNOSIS — E559 Vitamin D deficiency, unspecified: Secondary | ICD-10-CM

## 2020-07-05 DIAGNOSIS — L719 Rosacea, unspecified: Secondary | ICD-10-CM

## 2020-07-05 DIAGNOSIS — M1990 Unspecified osteoarthritis, unspecified site: Secondary | ICD-10-CM

## 2020-07-05 LAB — COMPLETE METABOLIC PANEL WITH GFR
CO2: 29 mmol/L (ref 20–32)
Calcium: 9.5 mg/dL (ref 8.6–10.4)
Chloride: 103 mmol/L (ref 98–110)
GFR, Est African American: 84 mL/min/{1.73_m2} (ref >=60)
Glucose, Bld: 91 mg/dL (ref 65–99)

## 2020-07-05 LAB — CBC WITH DIFFERENTIAL/PLATELET
Eosinophils Relative: 3.6 %
MCV: 94.4 fL (ref 80.0–100.0)
Platelets: 271 10*3/uL (ref 140–400)

## 2020-07-05 LAB — TSH: TSH: 2.25 mIU/L (ref 0.40–4.50)

## 2020-07-05 NOTE — Patient Instructions (Addendum)
  Ms. Kusch , Thank you for taking time to come for your Medicare Wellness Visit. I appreciate your ongoing commitment to your health goals. Please review the following plan we discussed and let me know if I can assist you in the future.   These are the goals we discussed: Goals    . LDL CALC < 130    . Weight (lb) < 160 lb (72.6 kg)       This is a list of the screening recommended for you and due dates:  Health Maintenance  Topic Date Due  . COVID-19 Vaccine (1) 07/21/2020*  . Mammogram  08/26/2021  . Tetanus Vaccine  05/01/2022  . Pap Smear  05/09/2023  . Colon Cancer Screening  07/13/2026  .  Hepatitis C: One time screening is recommended by Center for Disease Control  (CDC) for  adults born from [birth year] through 1967.   Completed  . HIV Screening  Completed  . HPV Vaccine  Aged Out  . Flu Shot  Discontinued  *Topic was postponed. The date shown is not the original due date.      Try azelaic acid 10% at night for skin    B12 levels are low   Good food sources of vitamin B12 are seafood/fish, almond milk, soymilk, lamb, yogurt, chicken, mozzarella cheese and you could try to incorporate these (fish and almond milk are your healthiest options for cholesterol - aim to get in at least 2 servings of fish weekly)  Consider adding sublingual b12 (dissolves under tongue) - you may have a genetic reason why you don't absorb well from diet. Will help with energy, memory/concentration, decrease nerve pain, and help with weight loss.    Vitamin D - most people are deficient.    It is a natural antiinflammatory and helps hair, skin, and nails, as well as reduces stroke and heart attack risk. It helps your bones and helps with mood. It also decreases numerous cancer risks, autoimmune disease and is associated with improved covid 19 outcomes.      Know what a healthy weight is for you (roughly BMI <25) and aim to maintain this  Aim for 7+ servings of fruits and vegetables  daily  65-80+ fluid ounces of water or unsweet tea for healthy kidneys  Limit to max 1 drink of alcohol per day; avoid smoking/tobacco  Limit animal fats in diet for cholesterol and heart health - choose grass fed whenever available  Avoid highly processed foods, and foods high in saturated/trans fats  Aim for low stress - take time to unwind and care for your mental health  Aim for 150 min of moderate intensity exercise weekly for heart health, and weights twice weekly for bone health  Aim for 7-9 hours of sleep daily    A great goal to work towards is aiming to get in a serving daily of some of the most nutritionally dense foods - G- BOMBS daily

## 2020-07-06 ENCOUNTER — Other Ambulatory Visit: Payer: Self-pay | Admitting: Adult Health

## 2020-07-06 LAB — URINALYSIS, ROUTINE W REFLEX MICROSCOPIC
Bilirubin Urine: NEGATIVE
Glucose, UA: NEGATIVE
Hgb urine dipstick: NEGATIVE
Hyaline Cast: NONE SEEN /LPF
Ketones, ur: NEGATIVE
Nitrite: NEGATIVE
Protein, ur: NEGATIVE
RBC / HPF: NONE SEEN /HPF (ref 0–2)
Specific Gravity, Urine: 1.017 (ref 1.001–1.035)
pH: 5.5 (ref 5.0–8.0)

## 2020-07-06 LAB — COMPLETE METABOLIC PANEL WITH GFR
AG Ratio: 1.7 (calc) (ref 1.0–2.5)
ALT: 24 U/L (ref 6–29)
AST: 17 U/L (ref 10–35)
Albumin: 4.4 g/dL (ref 3.6–5.1)
Alkaline phosphatase (APISO): 63 U/L (ref 37–153)
BUN: 13 mg/dL (ref 7–25)
Creat: 0.89 mg/dL (ref 0.50–1.05)
GFR, Est Non African American: 72 mL/min/{1.73_m2} (ref >=60)
Globulin: 2.6 g/dL (calc) (ref 1.9–3.7)
Potassium: 4.7 mmol/L (ref 3.5–5.3)
Sodium: 138 mmol/L (ref 135–146)
Total Bilirubin: 0.5 mg/dL (ref 0.2–1.2)
Total Protein: 7 g/dL (ref 6.1–8.1)

## 2020-07-06 LAB — CBC WITH DIFFERENTIAL/PLATELET
Absolute Monocytes: 400 cells/uL (ref 200–950)
Basophils Absolute: 31 cells/uL (ref 0–200)
Basophils Relative: 0.7 %
Eosinophils Absolute: 158 cells/uL (ref 15–500)
HCT: 43.8 % (ref 35.0–45.0)
Hemoglobin: 14.6 g/dL (ref 11.7–15.5)
Lymphs Abs: 1364 cells/uL (ref 850–3900)
MCH: 31.5 pg (ref 27.0–33.0)
MCHC: 33.3 g/dL (ref 32.0–36.0)
MPV: 10.1 fL (ref 7.5–12.5)
Monocytes Relative: 9.1 %
Neutro Abs: 2446 cells/uL (ref 1500–7800)
Neutrophils Relative %: 55.6 %
RBC: 4.64 10*6/uL (ref 3.80–5.10)
RDW: 12.4 % (ref 11.0–15.0)
Total Lymphocyte: 31 %
WBC: 4.4 10*3/uL (ref 3.8–10.8)

## 2020-07-06 LAB — LIPID PANEL
Cholesterol: 208 mg/dL — ABNORMAL HIGH (ref <200)
HDL: 56 mg/dL (ref >=50)
LDL Cholesterol (Calc): 131 mg/dL (calc) — ABNORMAL HIGH
Non-HDL Cholesterol (Calc): 152 mg/dL (calc) — ABNORMAL HIGH (ref <130)
Total CHOL/HDL Ratio: 3.7 (calc) (ref <5.0)
Triglycerides: 105 mg/dL (ref <150)

## 2020-07-06 LAB — MICROSCOPIC MESSAGE

## 2020-07-06 NOTE — Addendum Note (Signed)
Addended by: Izora Ribas on: 07/06/2020 08:41 AM   Modules accepted: Orders

## 2020-07-07 LAB — URINE CULTURE
MICRO NUMBER:: 11844805
SPECIMEN QUALITY:: ADEQUATE

## 2020-09-01 DIAGNOSIS — R234 Changes in skin texture: Secondary | ICD-10-CM | POA: Diagnosis not present

## 2020-09-01 DIAGNOSIS — D0512 Intraductal carcinoma in situ of left breast: Secondary | ICD-10-CM | POA: Diagnosis not present

## 2020-09-01 DIAGNOSIS — D0511 Intraductal carcinoma in situ of right breast: Secondary | ICD-10-CM | POA: Diagnosis not present

## 2020-09-01 DIAGNOSIS — C679 Malignant neoplasm of bladder, unspecified: Secondary | ICD-10-CM | POA: Diagnosis not present

## 2020-09-14 DIAGNOSIS — H6123 Impacted cerumen, bilateral: Secondary | ICD-10-CM | POA: Diagnosis not present

## 2020-10-27 DIAGNOSIS — M79672 Pain in left foot: Secondary | ICD-10-CM | POA: Diagnosis not present

## 2020-10-27 DIAGNOSIS — M722 Plantar fascial fibromatosis: Secondary | ICD-10-CM | POA: Diagnosis not present

## 2020-10-27 DIAGNOSIS — M7732 Calcaneal spur, left foot: Secondary | ICD-10-CM | POA: Diagnosis not present

## 2020-10-27 DIAGNOSIS — M216X1 Other acquired deformities of right foot: Secondary | ICD-10-CM | POA: Diagnosis not present

## 2020-11-17 DIAGNOSIS — M79672 Pain in left foot: Secondary | ICD-10-CM | POA: Diagnosis not present

## 2020-11-17 DIAGNOSIS — M722 Plantar fascial fibromatosis: Secondary | ICD-10-CM | POA: Diagnosis not present

## 2020-11-17 DIAGNOSIS — M216X1 Other acquired deformities of right foot: Secondary | ICD-10-CM | POA: Diagnosis not present

## 2020-11-17 DIAGNOSIS — M7732 Calcaneal spur, left foot: Secondary | ICD-10-CM | POA: Diagnosis not present

## 2020-12-16 DIAGNOSIS — H6123 Impacted cerumen, bilateral: Secondary | ICD-10-CM | POA: Diagnosis not present

## 2021-01-03 DIAGNOSIS — Z8042 Family history of malignant neoplasm of prostate: Secondary | ICD-10-CM | POA: Diagnosis not present

## 2021-01-03 DIAGNOSIS — Z86008 Personal history of in-situ neoplasm of other site: Secondary | ICD-10-CM | POA: Diagnosis not present

## 2021-01-03 DIAGNOSIS — Z809 Family history of malignant neoplasm, unspecified: Secondary | ICD-10-CM | POA: Diagnosis not present

## 2021-02-18 DIAGNOSIS — M79672 Pain in left foot: Secondary | ICD-10-CM | POA: Diagnosis not present

## 2021-02-18 DIAGNOSIS — M216X1 Other acquired deformities of right foot: Secondary | ICD-10-CM | POA: Diagnosis not present

## 2021-02-18 DIAGNOSIS — M7732 Calcaneal spur, left foot: Secondary | ICD-10-CM | POA: Diagnosis not present

## 2021-02-18 DIAGNOSIS — M722 Plantar fascial fibromatosis: Secondary | ICD-10-CM | POA: Diagnosis not present

## 2021-03-10 DIAGNOSIS — D3131 Benign neoplasm of right choroid: Secondary | ICD-10-CM | POA: Diagnosis not present

## 2021-03-10 DIAGNOSIS — H5213 Myopia, bilateral: Secondary | ICD-10-CM | POA: Diagnosis not present

## 2021-03-17 DIAGNOSIS — Z20828 Contact with and (suspected) exposure to other viral communicable diseases: Secondary | ICD-10-CM | POA: Diagnosis not present

## 2021-04-05 DIAGNOSIS — H6123 Impacted cerumen, bilateral: Secondary | ICD-10-CM | POA: Diagnosis not present

## 2021-07-05 ENCOUNTER — Encounter: Payer: BC Managed Care – PPO | Admitting: Adult Health

## 2021-07-05 DIAGNOSIS — H6123 Impacted cerumen, bilateral: Secondary | ICD-10-CM | POA: Diagnosis not present

## 2021-08-31 ENCOUNTER — Ambulatory Visit (INDEPENDENT_AMBULATORY_CARE_PROVIDER_SITE_OTHER): Payer: BC Managed Care – PPO | Admitting: Nurse Practitioner

## 2021-08-31 ENCOUNTER — Encounter: Payer: BC Managed Care – PPO | Admitting: Adult Health

## 2021-08-31 ENCOUNTER — Encounter: Payer: Self-pay | Admitting: Nurse Practitioner

## 2021-08-31 VITALS — BP 123/79 | HR 81 | Temp 96.9°F | Ht 65.5 in | Wt 167.4 lb

## 2021-08-31 DIAGNOSIS — Z0000 Encounter for general adult medical examination without abnormal findings: Secondary | ICD-10-CM

## 2021-08-31 DIAGNOSIS — Z13220 Encounter for screening for lipoid disorders: Secondary | ICD-10-CM

## 2021-08-31 DIAGNOSIS — Z1389 Encounter for screening for other disorder: Secondary | ICD-10-CM

## 2021-08-31 DIAGNOSIS — F338 Other recurrent depressive disorders: Secondary | ICD-10-CM

## 2021-08-31 DIAGNOSIS — L719 Rosacea, unspecified: Secondary | ICD-10-CM

## 2021-08-31 DIAGNOSIS — E559 Vitamin D deficiency, unspecified: Secondary | ICD-10-CM

## 2021-08-31 DIAGNOSIS — L111 Transient acantholytic dermatosis [Grover]: Secondary | ICD-10-CM

## 2021-08-31 DIAGNOSIS — Z0001 Encounter for general adult medical examination with abnormal findings: Secondary | ICD-10-CM

## 2021-08-31 DIAGNOSIS — E785 Hyperlipidemia, unspecified: Secondary | ICD-10-CM

## 2021-08-31 DIAGNOSIS — M1990 Unspecified osteoarthritis, unspecified site: Secondary | ICD-10-CM

## 2021-08-31 NOTE — Progress Notes (Signed)
Complete Physical  Assessment and Plan:  1. Encounter for general adult medical examination with abnormal findings Due Annually   2. Rosacea Follows Dermatology No recent flares. Controlled. Continue to monitor  3. Grover's disease Follows Dermatology No recent flares. Controlled. Continue to monitor  4. Arthritis Managed Recent flare left plantar fasciitis. Continue cortisone injections PRN Continue to monitor  5. Seasonal affective disorder [facility]) Managed well. Continue to monitor  6. Hyperlipidemia, unspecified hyperlipidemia type Uncontrolled Discussed lifestyle modifications. Recommended diet heavy in fruits and veggies, omega 3's. Decrease consumption of animal meats, cheeses, and dairy products. Remain active and exercise as tolerated. Continue to monitor.   7. Vitamin D deficiency Not at goal. Not on supplement. Discussed benefits of Vitamin D.   8. Screening for hematuria or proteinuria UA Continue to monitor Continue to follow with GYN  Orders Placed This Encounter  Procedures   CBC with Differential/Platelet   COMPLETE METABOLIC PANEL WITH GFR   Lipid panel   VITAMIN D 25 Hydroxy (Vit-D Deficiency, Fractures)   Urinalysis, Routine w reflex microscopic    Discussed med's effects and SE's. Screening labs and tests as requested with regular follow-up as recommended.  Future Appointments  Date Time Provider [place]  09/01/2022  9:00 AM Alycia Rossetti, NP [facility] None     HPI 56 y.o. female  presents for a complete physical. She has Rosacea; Vitamin D deficiency; Grover's disease; Seasonal affective disorder ([place]); Arthritis; Hyperlipidemia; and History of ductal carcinoma in situ (DCIS) of breast on their problem list.  Married in August 2020 to long time boyfriend, no kids.   She lost job during covid 19, continues to stay at home.   Hx of depression/seasonal affective disorder, just poor energy and motivation during the  winter, never wanted to do wellbutrin, doing better since not at former job.   She has DCIS of L breast in 2019, s/p lumpectomy, followed by [facility], last mammogram 12/2021.  She will have follow ups MGM in 10/2021.    She has followed with derm for eczema/rosacea/Grover's disease in the past. She does not follow consistently, only PRN.  No new recent flare.  She will visit Derm PRN or if any new concern.  BMI is Body mass index is 27.43 kg/m., she has been working on diet, generally healthy but admits to sweets at night, needs to stop.  Goes to the gym 1-3 days a week. Walks her dog daily.  Drinks coffee, less soda. Sleeps ok if watches caffeine. She has lost 4 lb in the last year. Wt Readings from Last 3 Encounters:  08/31/21 167 lb 6.4 oz (75.9 kg)  07/05/20 171 lb 6.4 oz (77.7 kg)  05/26/19 167 lb (75.8 kg)   Her blood pressure has been controlled at home, today their BP is BP: 123/79  She does workout, will walk her lab and working out with a trainer. She denies chest pain, shortness of breath, dizziness.    Her cholesterol is not at goal of LDL <100, atypically elevated last visit.  The cholesterol last visit was:  Lab Results  Component Value Date   CHOL 208 (H) 07/05/2020   HDL 56 07/05/2020   LDLCALC 131 (H) 07/05/2020   TRIG 105 07/05/2020   CHOLHDL 3.7 07/05/2020     Lab Results  Component Value Date   HGBA1C 5.2 05/26/2019   She is in CKD II range. Last GFR:  Lab Results  Component Value Date   GFRNONAA 72 07/05/2020   She has  Vitamin D def, she admits not on vitamin D, not covered by insurance.  She will try to take OTC. Lab Results  Component Value Date   VD25OH 22 (L) 05/26/2019    She is not currently on a supplement Lab Results  Component Value Date   VITAMINB12 390 05/09/2018     Current Medications:  Current Outpatient Medications on File Prior to Visit  Medication Sig   acetaminophen (TYLENOL) 500 MG tablet Take 500 mg by mouth every 6 (six) hours as  needed.   naproxen sodium (ANAPROX) 220 MG tablet Take 220 mg by mouth as needed.   No current facility-administered medications on file prior to visit.    Health Maintenance:  Immunization History  Administered Date(s) Administered   Tdap 05/01/2012   TDAP: 2014 Pneumovax: N/A Prevnar: NA Flu vaccine: declines Shingrix: declines  Covid 19: declines   Pap: 05/2018 - due 5 years, never abnormal pap MGM:  UTD 2022, due again in 10/2021. Diagnostic MGM bilateral at [facility] for DCIS DEXA: N/A  Colonoscopy:  07/12/2016, normal, 10 years, Dr. Silverio Decamp  EGD: N/A  Last eye: 08/2021, glasses, Dr. Delman Cheadle Last dental: 08/2021, has scheduled this month   Medical History:  Past Medical History:  Diagnosis Date   Arthritis    cervical neck, CT 2014   Grover's disease    Plantar fasciitis    Rosacea 01/01/2009   Qualifier: Diagnosis of  By: Jerold Coombe     Seasonal affective disorder [facility])    Vitamin D deficiency    Allergies No Active Allergies  SURGICAL HISTORY She  has a past surgical history that includes Tonsillectomy and adenoidectomy; Hernia repair (1979); Mandible surgery; and Breast lumpectomy with radioactive seed localization (Left, 08/10/2017). FAMILY HISTORY Her family history includes Arthritis in her sister; Asthma in her sister; Cancer in her father; Hypertension in her mother; Peptic Ulcer Disease in her father. SOCIAL HISTORY She  reports that she has never smoked. She has never used smokeless tobacco. She reports current alcohol use. She reports that she does not use drugs.   Review of Systems  Constitutional:  Negative for malaise/fatigue and weight loss.  HENT:  Negative for hearing loss and tinnitus.   Eyes:  Negative for blurred vision and double vision.  Respiratory:  Negative for cough, shortness of breath and wheezing.   Cardiovascular:  Negative for chest pain, palpitations, orthopnea, claudication and leg swelling.  Gastrointestinal:  Negative  for abdominal pain, blood in stool, constipation, diarrhea, heartburn, melena, nausea and vomiting.  Genitourinary: Negative.   Musculoskeletal:  Negative for joint pain and myalgias.  Skin:  Negative for rash.  Neurological:  Negative for dizziness, tingling, sensory change, weakness and headaches.  Endo/Heme/Allergies:  Negative for polydipsia.  Psychiatric/Behavioral:  Positive for depression (improved). Negative for substance abuse and suicidal ideas. The patient is not nervous/anxious and does not have insomnia.   All other systems reviewed and are negative.  Physical Exam: Estimated body mass index is 27.43 kg/m as calculated from the following:   Height as of this encounter: 5' 5.5" (1.664 m).   Weight as of this encounter: 167 lb 6.4 oz (75.9 kg). Vitals:   08/31/21 0901  BP: 123/79  Pulse: 81  Temp: (!) 96.9 F (36.1 C)  SpO2: 98%   General Appearance: Well nourished, in no apparent distress. Eyes: PERRLA, EOMs, conjunctiva no swelling or erythema Sinuses: No Frontal/maxillary tenderness ENT/Mouth: Ext aud canals clear, but very small and curved canals, follows with ENT, TMs  not visualized.  Good dentition. No erythema, swelling, or exudate on post pharynx. Tonsils not swollen or erythematous. Hearing normal.  Neck: Supple, thyroid normal. No bruits Respiratory: Respiratory effort normal, BS equal bilaterally without rales, rhonchi, wheezing or stridor. Cardio: RRR without murmurs, rubs or gallops. Brisk peripheral pulses without edema.  Chest: symmetric, with normal excursions and percussion. Breasts: Declines, [facility] following, does regular self checks Abdomen: Soft, +BS. Non tender, no guarding, rebound, hernias, masses, or organomegaly.  Lymphatics: Non tender without lymphadenopathy.  Genitourinary: defer  Musculoskeletal: Full ROM all peripheral extremities,5/5 strength  and normal gait.  Skin: Warm, dry without  lesions, ecchymosis.  Neuro: Cranial nerves intact,  reflexes equal bilaterally. Normal muscle tone, no cerebellar symptoms. Sensation intact.  Psych: Awake and oriented X 3, normal affect, Insight and Judgment appropriate.   EKG: Defers.     Kainoa Swoboda 9:03 AM

## 2021-08-31 NOTE — Patient Instructions (Signed)

## 2021-09-01 LAB — URINALYSIS, ROUTINE W REFLEX MICROSCOPIC
Bilirubin Urine: NEGATIVE
Glucose, UA: NEGATIVE
Hgb urine dipstick: NEGATIVE
Ketones, ur: NEGATIVE
Leukocytes,Ua: NEGATIVE
Nitrite: NEGATIVE
Protein, ur: NEGATIVE
Specific Gravity, Urine: 1.019 (ref 1.001–1.035)
pH: 7.5 (ref 5.0–8.0)

## 2021-09-01 LAB — CBC WITH DIFFERENTIAL/PLATELET
Absolute Monocytes: 473 cells/uL (ref 200–950)
Basophils Absolute: 42 cells/uL (ref 0–200)
Basophils Relative: 0.8 %
Eosinophils Absolute: 182 cells/uL (ref 15–500)
Eosinophils Relative: 3.5 %
HCT: 44.4 % (ref 35.0–45.0)
Hemoglobin: 15 g/dL (ref 11.7–15.5)
Lymphs Abs: 1373 cells/uL (ref 850–3900)
MCH: 31.1 pg (ref 27.0–33.0)
MCHC: 33.8 g/dL (ref 32.0–36.0)
MCV: 92.1 fL (ref 80.0–100.0)
MPV: 10.1 fL (ref 7.5–12.5)
Monocytes Relative: 9.1 %
Neutro Abs: 3130 cells/uL (ref 1500–7800)
Neutrophils Relative %: 60.2 %
Platelets: 263 10*3/uL (ref 140–400)
RBC: 4.82 10*6/uL (ref 3.80–5.10)
RDW: 11.8 % (ref 11.0–15.0)
Total Lymphocyte: 26.4 %
WBC: 5.2 10*3/uL (ref 3.8–10.8)

## 2021-09-01 LAB — LIPID PANEL
Cholesterol: 205 mg/dL — ABNORMAL HIGH (ref <200)
HDL: 63 mg/dL (ref >=50)
LDL Cholesterol (Calc): 123 mg/dL (calc) — ABNORMAL HIGH
Non-HDL Cholesterol (Calc): 142 mg/dL (calc) — ABNORMAL HIGH (ref <130)
Total CHOL/HDL Ratio: 3.3 (calc) (ref <5.0)
Triglycerides: 90 mg/dL (ref <150)

## 2021-09-01 LAB — VITAMIN D 25 HYDROXY (VIT D DEFICIENCY, FRACTURES): Vit D, 25-Hydroxy: 28 ng/mL — ABNORMAL LOW (ref 30–100)

## 2021-09-01 LAB — COMPLETE METABOLIC PANEL WITH GFR
AG Ratio: 1.8 (calc) (ref 1.0–2.5)
ALT: 19 U/L (ref 6–29)
AST: 14 U/L (ref 10–35)
Albumin: 4.5 g/dL (ref 3.6–5.1)
Alkaline phosphatase (APISO): 76 U/L (ref 37–153)
BUN: 12 mg/dL (ref 7–25)
CO2: 27 mmol/L (ref 20–32)
Calcium: 9.7 mg/dL (ref 8.6–10.4)
Chloride: 105 mmol/L (ref 98–110)
Creat: 0.94 mg/dL (ref 0.50–1.03)
Globulin: 2.5 g/dL (calc) (ref 1.9–3.7)
Glucose, Bld: 92 mg/dL (ref 65–99)
Potassium: 4.8 mmol/L (ref 3.5–5.3)
Sodium: 139 mmol/L (ref 135–146)
Total Bilirubin: 0.5 mg/dL (ref 0.2–1.2)
Total Protein: 7 g/dL (ref 6.1–8.1)
eGFR: 71 mL/min/{1.73_m2} (ref >=60)

## 2021-10-06 DIAGNOSIS — H6123 Impacted cerumen, bilateral: Secondary | ICD-10-CM | POA: Diagnosis not present

## 2021-10-07 DIAGNOSIS — D0512 Intraductal carcinoma in situ of left breast: Secondary | ICD-10-CM | POA: Diagnosis not present

## 2021-10-07 DIAGNOSIS — Z1379 Encounter for other screening for genetic and chromosomal anomalies: Secondary | ICD-10-CM | POA: Diagnosis not present

## 2021-12-19 DIAGNOSIS — B351 Tinea unguium: Secondary | ICD-10-CM | POA: Diagnosis not present

## 2021-12-19 DIAGNOSIS — Z1283 Encounter for screening for malignant neoplasm of skin: Secondary | ICD-10-CM | POA: Diagnosis not present

## 2021-12-19 DIAGNOSIS — D225 Melanocytic nevi of trunk: Secondary | ICD-10-CM | POA: Diagnosis not present

## 2021-12-19 DIAGNOSIS — D2272 Melanocytic nevi of left lower limb, including hip: Secondary | ICD-10-CM | POA: Diagnosis not present

## 2022-01-16 DIAGNOSIS — X32XXXA Exposure to sunlight, initial encounter: Secondary | ICD-10-CM | POA: Diagnosis not present

## 2022-01-16 DIAGNOSIS — L820 Inflamed seborrheic keratosis: Secondary | ICD-10-CM | POA: Diagnosis not present

## 2022-01-16 DIAGNOSIS — L570 Actinic keratosis: Secondary | ICD-10-CM | POA: Diagnosis not present

## 2022-01-18 DIAGNOSIS — H6123 Impacted cerumen, bilateral: Secondary | ICD-10-CM | POA: Diagnosis not present

## 2022-03-16 DIAGNOSIS — H52203 Unspecified astigmatism, bilateral: Secondary | ICD-10-CM | POA: Diagnosis not present

## 2022-03-16 DIAGNOSIS — H5213 Myopia, bilateral: Secondary | ICD-10-CM | POA: Diagnosis not present

## 2022-03-16 DIAGNOSIS — H40053 Ocular hypertension, bilateral: Secondary | ICD-10-CM | POA: Diagnosis not present

## 2022-03-30 DIAGNOSIS — L570 Actinic keratosis: Secondary | ICD-10-CM | POA: Diagnosis not present

## 2022-03-30 DIAGNOSIS — X32XXXD Exposure to sunlight, subsequent encounter: Secondary | ICD-10-CM | POA: Diagnosis not present

## 2022-05-01 DIAGNOSIS — H6123 Impacted cerumen, bilateral: Secondary | ICD-10-CM | POA: Diagnosis not present

## 2022-08-14 DIAGNOSIS — H6123 Impacted cerumen, bilateral: Secondary | ICD-10-CM | POA: Diagnosis not present

## 2022-08-31 NOTE — Progress Notes (Signed)
Complete Physical  Assessment and Plan:  Encounter for general adult medical examination with abnormal findings Due Annually  Colonoscopy UTD due 2028  Rosacea Follows Dermatology No recent flares. Controlled. Continue to monitor  Grover's disease Follows Dermatology No recent flares. Controlled. Continue to monitor  Arthritis Managed Recent flare left plantar fasciitis. Continue cortisone injections PRN Continue to monitor  Seasonal affective disorder [facility]) Managed well. Continue to monitor  Hyperlipidemia, unspecified hyperlipidemia type Uncontrolled Discussed lifestyle modifications. Recommended diet heavy in fruits and veggies, omega 3's. Decrease consumption of animal meats, cheeses, and dairy products. Remain active and exercise as tolerated. Continue to monitor.   Vitamin D deficiency Not at goal. Not on supplement. Discussed benefits of Vitamin D.  Overweight Long discussion about weight loss, diet, and exercise Recommended diet heavy in fruits and veggies and low in animal meats, cheeses, and dairy products, appropriate calorie intake Patient will work on  exercise and decreasing saturated fats and simple carbs Follow up at next visit   Screening for ischemic heart disease -     EKG 12-Lead  Screening for hematuria or proteinuria -     Urinalysis, Routine w reflex microscopic -     Microalbumin / creatinine urine ratio  Screening for thyroid disorder -     TSH  Screening for AAA (abdominal aortic aneurysm) -     [place], RETROPERITNL ABD,  LTD  Medication management -     CBC with Differential/Platelet -     COMPLETE METABOLIC PANEL WITH GFR -     Magnesium -     Lipid panel -     TSH -     Hemoglobin A1c -     VITAMIN D 25 Hydroxy (Vit-D Deficiency, Fractures) -     EKG 12-Lead -     [place], RETROPERITNL ABD,  LTD -     Urinalysis, Routine w reflex microscopic -     Microalbumin / creatinine urine ratio  Abnormal glucose Continue with  diet and exercise -     Hemoglobin A1c  History of ductal carcinoma in situ (DCIS) of breast Continue to follow with [facility]    Discussed med's effects and SE's. Screening labs and tests as requested with regular follow-up as recommended.  Future Appointments  Date Time Provider Department Center  09/04/2023 10:00 AM Raynelle Dick, NP [facility] None     HPI 57 y.o. female  presents for a complete physical. She has Rosacea; Vitamin D deficiency; Grover's disease; Seasonal affective disorder ([facility]); Arthritis; Hyperlipidemia; and History of ductal carcinoma in situ (DCIS) of breast on their problem list.  Married in August 2020 to long time boyfriend, no kids.   Her mom died 11/30/2022 at age 46 of atrial fib and possible CHF. Her brother who is 59 also diagnosed with atrial fib.   Hx of depression/seasonal affective disorder, she continues to see grief counselor and is still having some issues with grief but is not persistently depressed. Does not feel the need for medication. 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   She has DCIS of L breast in 2019, s/p lumpectomy, followed by [facility], last mammogram 12/2021.  She will have follow ups MGM, scheduled for 10/2022   She has followed with derm for eczema/rosacea/Grover's disease in the past. She does not follow consistently, only PRN.  No new recent flare.  She will visit Derm PRN or  if any new concern.  BMI is Body mass index is 26.35 kg/m., she has been working on diet, generally healthy but admits to sweets at night, needs to stop.  Goes to the gym 1-3 days a week. Walks her dog daily.   Wt Readings from Last 3 Encounters:  09/01/22 160 lb 12.8 oz (72.9 kg)  08/31/21 167 lb 6.4 oz (75.9 kg)  07/05/20 171 lb 6.4 oz (77.7 kg)   Her blood pressure has been controlled at home, today their BP is BP: 130/78  BP Readings from Last 3 Encounters:   09/01/22 130/78  08/31/21 123/79  07/05/20 124/80  She does workout, will walk her lab and working out with a trainer. She denies chest pain, shortness of breath, dizziness.    Her cholesterol is not at goal of LDL <100. She is not on medication and is trying to lower through diet and exercise.  The cholesterol last visit was:  Lab Results  Component Value Date   CHOL 205 (H) 08/31/2021   HDL 63 08/31/2021   LDLCALC 123 (H) 08/31/2021   TRIG 90 08/31/2021   CHOLHDL 3.3 08/31/2021     She has a history of abnormal glucose but recent have been in normal range. Last A1c was: Lab Results  Component Value Date   HGBA1C 5.2 05/26/2019   Trying to drink more water She is in CKD II range. Last GFR:  Lab Results  Component Value Date   EGFR 71 08/31/2021    She has Vitamin D def, she admits not on vitamin D, does not take everyday.  She will try to take OTC. Lab Results  Component Value Date   VD25OH 28 (L) 08/31/2021    She is not currently on a supplement Lab Results  Component Value Date   VITAMINB12 390 05/09/2018     Current Medications:  Current Outpatient Medications on File Prior to Visit  Medication Sig   acetaminophen (TYLENOL) 500 MG tablet Take 500 mg by mouth every 6 (six) hours as needed.   naproxen sodium (ANAPROX) 220 MG tablet Take 220 mg by mouth as needed.   No current facility-administered medications on file prior to visit.    Health Maintenance:  Immunization History  Administered Date(s) Administered   Tdap 05/01/2012   Health Maintenance  Topic Date Due   COVID-19 Vaccine (1) Never done   Zoster Vaccines- Shingrix (1 of 2) Never done   MAMMOGRAM  08/26/2021   DTaP/Tdap/Td (2 - Td or Tdap) 05/01/2022   PAP SMEAR-Modifier  05/09/2023   Colonoscopy  07/13/2026   Hepatitis C Screening  Completed   HIV Screening  Completed   HPV VACCINES  Aged Out   INFLUENZA VACCINE  Discontinued   Mammograms at [facility]- last 10/2021  Last eye: 08/2021,  glasses, Dr. Emily Filbert Last dental: 08/2021, has scheduled this month   Medical History:  Past Medical History:  Diagnosis Date   Arthritis    cervical neck, CT 2014   Grover's disease    Plantar fasciitis    Rosacea 01/01/2009   Qualifier: Diagnosis of  By: Janit Bern     Seasonal affective disorder [facility])    Vitamin D deficiency    Allergies No Active Allergies  SURGICAL HISTORY She  has a past surgical history that includes Tonsillectomy and adenoidectomy; Hernia repair (1979); Mandible surgery; and Breast lumpectomy with radioactive seed localization (Left, 08/10/2017). FAMILY HISTORY Her family history includes Arthritis in her sister; Asthma in her sister; Cancer in  her father; Hypertension in her mother; Peptic Ulcer Disease in her father. SOCIAL HISTORY She  reports that she has never smoked. She has never used smokeless tobacco. She reports current alcohol use. She reports that she does not use drugs.   Review of Systems  Constitutional:  Negative for malaise/fatigue and weight loss.  HENT:  Negative for hearing loss and tinnitus.   Eyes:  Negative for blurred vision and double vision.  Respiratory:  Negative for cough, shortness of breath and wheezing.   Cardiovascular:  Negative for chest pain, palpitations, orthopnea, claudication and leg swelling.  Gastrointestinal:  Negative for abdominal pain, blood in stool, constipation, diarrhea, heartburn, melena, nausea and vomiting.  Genitourinary: Negative.   Musculoskeletal:  Positive for back pain (left lumbar pain). Negative for joint pain and myalgias.  Skin:  Negative for rash.  Neurological:  Negative for dizziness, tingling, sensory change, weakness and headaches.  Endo/Heme/Allergies:  Negative for polydipsia.  Psychiatric/Behavioral:  Positive for depression (improved). Negative for substance abuse and suicidal ideas. The patient is not nervous/anxious and does not have insomnia.   All other systems reviewed and are  negative.  Physical Exam: Estimated body mass index is 26.35 kg/m as calculated from the following:   Height as of this encounter: 5' 5.5" (1.664 m).   Weight as of this encounter: 160 lb 12.8 oz (72.9 kg). Vitals:   09/01/22 0857  BP: 130/78  Pulse: 76  Resp: 16  Temp: 97.9 F (36.6 C)  SpO2: 98%   General Appearance: Well nourished, in no apparent distress. Eyes: PERRLA, EOMs, conjunctiva no swelling or erythema Sinuses: No Frontal/maxillary tenderness ENT/Mouth: Ext aud canals clear, but very small and curved canals, follows with ENT, TMs not visualized.  Good dentition. No erythema, swelling, or exudate on post pharynx. Tonsils not swollen or erythematous. Hearing normal.  Neck: Supple, thyroid normal. No bruits Respiratory: Respiratory effort normal, BS equal bilaterally without rales, rhonchi, wheezing or stridor. Cardio: RRR without murmurs, rubs or gallops. Brisk peripheral pulses without edema.  Chest: symmetric, with normal excursions and percussion. Breasts: Declines, [facility] following, does regular self checks Abdomen: Soft, +BS. Non tender, no guarding, rebound, hernias, masses, or organomegaly.  Lymphatics: Non tender without lymphadenopathy.  Genitourinary: defer  Musculoskeletal: Full ROM all peripheral extremities,5/5 strength  and normal gait.  Skin: Warm, dry without  lesions, ecchymosis.  Neuro: Cranial nerves intact, reflexes equal bilaterally. Normal muscle tone, no cerebellar symptoms. Sensation intact.  Psych: Awake and oriented X 3, normal affect, Insight and Judgment appropriate.   EKG: IRBBB, NSR    AAA: < 3 cm  Blimy Napoleon E  9:00 AM

## 2022-09-01 ENCOUNTER — Encounter: Payer: Self-pay | Admitting: Nurse Practitioner

## 2022-09-01 ENCOUNTER — Ambulatory Visit (INDEPENDENT_AMBULATORY_CARE_PROVIDER_SITE_OTHER): Payer: BC Managed Care – PPO | Admitting: Nurse Practitioner

## 2022-09-01 VITALS — BP 130/78 | HR 76 | Temp 97.9°F | Resp 16 | Ht 65.5 in | Wt 160.8 lb

## 2022-09-01 DIAGNOSIS — L719 Rosacea, unspecified: Secondary | ICD-10-CM

## 2022-09-01 DIAGNOSIS — E663 Overweight: Secondary | ICD-10-CM

## 2022-09-01 DIAGNOSIS — Z1389 Encounter for screening for other disorder: Secondary | ICD-10-CM

## 2022-09-01 DIAGNOSIS — M1990 Unspecified osteoarthritis, unspecified site: Secondary | ICD-10-CM

## 2022-09-01 DIAGNOSIS — L111 Transient acantholytic dermatosis [Grover]: Secondary | ICD-10-CM

## 2022-09-01 DIAGNOSIS — Z79899 Other long term (current) drug therapy: Secondary | ICD-10-CM | POA: Diagnosis not present

## 2022-09-01 DIAGNOSIS — Z1329 Encounter for screening for other suspected endocrine disorder: Secondary | ICD-10-CM

## 2022-09-01 DIAGNOSIS — E785 Hyperlipidemia, unspecified: Secondary | ICD-10-CM

## 2022-09-01 DIAGNOSIS — I700 Atherosclerosis of aorta: Secondary | ICD-10-CM | POA: Diagnosis not present

## 2022-09-01 DIAGNOSIS — Z13220 Encounter for screening for lipoid disorders: Secondary | ICD-10-CM | POA: Diagnosis not present

## 2022-09-01 DIAGNOSIS — Z0000 Encounter for general adult medical examination without abnormal findings: Secondary | ICD-10-CM | POA: Diagnosis not present

## 2022-09-01 DIAGNOSIS — Z0001 Encounter for general adult medical examination with abnormal findings: Secondary | ICD-10-CM

## 2022-09-01 DIAGNOSIS — E559 Vitamin D deficiency, unspecified: Secondary | ICD-10-CM | POA: Diagnosis not present

## 2022-09-01 DIAGNOSIS — Z136 Encounter for screening for cardiovascular disorders: Secondary | ICD-10-CM

## 2022-09-01 DIAGNOSIS — I10 Essential (primary) hypertension: Secondary | ICD-10-CM

## 2022-09-01 DIAGNOSIS — F338 Other recurrent depressive disorders: Secondary | ICD-10-CM

## 2022-09-01 DIAGNOSIS — Z86000 Personal history of in-situ neoplasm of breast: Secondary | ICD-10-CM

## 2022-09-01 DIAGNOSIS — Z131 Encounter for screening for diabetes mellitus: Secondary | ICD-10-CM | POA: Diagnosis not present

## 2022-09-01 DIAGNOSIS — R7309 Other abnormal glucose: Secondary | ICD-10-CM

## 2022-09-01 NOTE — Patient Instructions (Signed)

## 2022-09-02 LAB — CBC WITH DIFFERENTIAL/PLATELET
Absolute Monocytes: 596 cells/uL (ref 200–950)
Basophils Absolute: 30 cells/uL (ref 0–200)
Basophils Relative: 0.5 %
Eosinophils Absolute: 171 cells/uL (ref 15–500)
Eosinophils Relative: 2.9 %
HCT: 44 % (ref 35.0–45.0)
Hemoglobin: 14.7 g/dL (ref 11.7–15.5)
Lymphs Abs: 1510 cells/uL (ref 850–3900)
MCH: 30.9 pg (ref 27.0–33.0)
MCHC: 33.4 g/dL (ref 32.0–36.0)
MCV: 92.4 fL (ref 80.0–100.0)
MPV: 10.3 fL (ref 7.5–12.5)
Monocytes Relative: 10.1 %
Neutro Abs: 3593 cells/uL (ref 1500–7800)
Neutrophils Relative %: 60.9 %
Platelets: 272 10*3/uL (ref 140–400)
RBC: 4.76 10*6/uL (ref 3.80–5.10)
RDW: 12.6 % (ref 11.0–15.0)
Total Lymphocyte: 25.6 %
WBC: 5.9 10*3/uL (ref 3.8–10.8)

## 2022-09-02 LAB — COMPLETE METABOLIC PANEL WITH GFR
AG Ratio: 1.9 (calc) (ref 1.0–2.5)
ALT: 21 U/L (ref 6–29)
AST: 15 U/L (ref 10–35)
Albumin: 4.8 g/dL (ref 3.6–5.1)
Alkaline phosphatase (APISO): 90 U/L (ref 37–153)
BUN: 9 mg/dL (ref 7–25)
CO2: 26 mmol/L (ref 20–32)
Calcium: 9.8 mg/dL (ref 8.6–10.4)
Chloride: 104 mmol/L (ref 98–110)
Creat: 0.84 mg/dL (ref 0.50–1.03)
Globulin: 2.5 g/dL (calc) (ref 1.9–3.7)
Glucose, Bld: 85 mg/dL (ref 65–99)
Potassium: 4.7 mmol/L (ref 3.5–5.3)
Sodium: 140 mmol/L (ref 135–146)
Total Bilirubin: 0.5 mg/dL (ref 0.2–1.2)
Total Protein: 7.3 g/dL (ref 6.1–8.1)
eGFR: 80 mL/min/{1.73_m2} (ref >=60)

## 2022-09-02 LAB — LIPID PANEL
Cholesterol: 218 mg/dL — ABNORMAL HIGH (ref <200)
HDL: 81 mg/dL (ref >=50)
LDL Cholesterol (Calc): 123 mg/dL (calc) — ABNORMAL HIGH
Non-HDL Cholesterol (Calc): 137 mg/dL (calc) — ABNORMAL HIGH (ref <130)
Total CHOL/HDL Ratio: 2.7 (calc) (ref <5.0)
Triglycerides: 57 mg/dL (ref <150)

## 2022-09-02 LAB — URINALYSIS, ROUTINE W REFLEX MICROSCOPIC
Bilirubin Urine: NEGATIVE
Glucose, UA: NEGATIVE
Hgb urine dipstick: NEGATIVE
Ketones, ur: NEGATIVE
Leukocytes,Ua: NEGATIVE
Nitrite: NEGATIVE
Protein, ur: NEGATIVE
Specific Gravity, Urine: 1.004 (ref 1.001–1.035)
pH: 6 (ref 5.0–8.0)

## 2022-09-02 LAB — HEMOGLOBIN A1C
Hgb A1c MFr Bld: 5.6 % of total Hgb (ref <5.7)
Mean Plasma Glucose: 114 mg/dL
eAG (mmol/L): 6.3 mmol/L

## 2022-09-02 LAB — MICROALBUMIN / CREATININE URINE RATIO
Creatinine, Urine: 18 mg/dL — ABNORMAL LOW (ref 20–275)
Microalb, Ur: <0.2 mg/dL

## 2022-09-02 LAB — VITAMIN D 25 HYDROXY (VIT D DEFICIENCY, FRACTURES): Vit D, 25-Hydroxy: 27 ng/mL — ABNORMAL LOW (ref 30–100)

## 2022-09-02 LAB — MAGNESIUM: Magnesium: 2.1 mg/dL (ref 1.5–2.5)

## 2022-09-02 LAB — TSH: TSH: 2.13 mIU/L (ref 0.40–4.50)

## 2022-10-05 DIAGNOSIS — L570 Actinic keratosis: Secondary | ICD-10-CM | POA: Diagnosis not present

## 2022-10-05 DIAGNOSIS — L905 Scar conditions and fibrosis of skin: Secondary | ICD-10-CM | POA: Diagnosis not present

## 2022-10-05 DIAGNOSIS — X32XXXD Exposure to sunlight, subsequent encounter: Secondary | ICD-10-CM | POA: Diagnosis not present

## 2022-10-20 DIAGNOSIS — Z86000 Personal history of in-situ neoplasm of breast: Secondary | ICD-10-CM | POA: Diagnosis not present

## 2022-10-20 DIAGNOSIS — Z09 Encounter for follow-up examination after completed treatment for conditions other than malignant neoplasm: Secondary | ICD-10-CM | POA: Diagnosis not present

## 2022-10-20 DIAGNOSIS — Z1379 Encounter for other screening for genetic and chromosomal anomalies: Secondary | ICD-10-CM | POA: Diagnosis not present

## 2022-10-20 DIAGNOSIS — R928 Other abnormal and inconclusive findings on diagnostic imaging of breast: Secondary | ICD-10-CM | POA: Diagnosis not present

## 2022-10-20 DIAGNOSIS — D0512 Intraductal carcinoma in situ of left breast: Secondary | ICD-10-CM | POA: Diagnosis not present

## 2022-11-09 ENCOUNTER — Other Ambulatory Visit: Payer: Self-pay | Admitting: Oncology

## 2022-11-09 DIAGNOSIS — Z006 Encounter for examination for normal comparison and control in clinical research program: Secondary | ICD-10-CM

## 2022-12-11 ENCOUNTER — Ambulatory Visit (INDEPENDENT_AMBULATORY_CARE_PROVIDER_SITE_OTHER): Payer: BC Managed Care – PPO | Admitting: Otolaryngology

## 2022-12-11 ENCOUNTER — Encounter (INDEPENDENT_AMBULATORY_CARE_PROVIDER_SITE_OTHER): Payer: Self-pay | Admitting: Otolaryngology

## 2022-12-11 VITALS — Ht 66.0 in | Wt 163.0 lb

## 2022-12-11 DIAGNOSIS — H6123 Impacted cerumen, bilateral: Secondary | ICD-10-CM

## 2022-12-11 NOTE — Progress Notes (Signed)
Patient ID: Wanda Wilson, female   DOB: November 29, 1965, 57 y.o.   MRN: [id number]  CC: Recurrent cerumen impaction  Procedure: Bilateral cerumen disimpaction.    Indication: Cerumen impaction, resulting in ear discomfort and conductive hearing loss.    Description: The patient is placed supine on the exam table.  Under the operating microscope, the right ear canal is examined and is noted to be impacted with cerumen.  The cerumen is carefully removed with a combination of suction catheters, cerumen curette, and alligator forceps.  After the cerumen removal, the ear canal and tympanic membrane are noted to be normal.  No middle ear effusion is noted.  The same procedure is then repeated on the left side without exception. The patient tolerated the procedure well.  Follow-up care:  The patient is instructed not to use Q-tips to clean the ear canals. The patient will follow up in 3 months.

## 2023-01-10 ENCOUNTER — Other Ambulatory Visit: Payer: BC Managed Care – PPO

## 2023-01-17 ENCOUNTER — Other Ambulatory Visit
Admission: RE | Admit: 2023-01-17 | Discharge: 2023-01-17 | Disposition: A | Discharge status: Home / Self Care | Payer: BC Managed Care – PPO | Source: Ambulatory Visit | Attending: Oncology | Admitting: Oncology

## 2023-01-17 DIAGNOSIS — Z006 Encounter for examination for normal comparison and control in clinical research program: Secondary | ICD-10-CM | POA: Insufficient documentation

## 2023-01-27 LAB — HELIX MOLECULAR SCREEN: Genetic Analysis Overall Interpretation: NEGATIVE

## 2023-01-27 LAB — GENECONNECT MOLECULAR SCREEN

## 2023-03-19 DIAGNOSIS — H40053 Ocular hypertension, bilateral: Secondary | ICD-10-CM | POA: Diagnosis not present

## 2023-03-19 DIAGNOSIS — H5213 Myopia, bilateral: Secondary | ICD-10-CM | POA: Diagnosis not present

## 2023-03-26 ENCOUNTER — Ambulatory Visit (INDEPENDENT_AMBULATORY_CARE_PROVIDER_SITE_OTHER): Payer: BC Managed Care – PPO

## 2023-04-05 DIAGNOSIS — L308 Other specified dermatitis: Secondary | ICD-10-CM | POA: Diagnosis not present

## 2023-04-05 DIAGNOSIS — L570 Actinic keratosis: Secondary | ICD-10-CM | POA: Diagnosis not present

## 2023-04-05 DIAGNOSIS — X32XXXD Exposure to sunlight, subsequent encounter: Secondary | ICD-10-CM | POA: Diagnosis not present

## 2023-06-13 DIAGNOSIS — H93293 Other abnormal auditory perceptions, bilateral: Secondary | ICD-10-CM | POA: Diagnosis not present

## 2023-06-13 DIAGNOSIS — H6123 Impacted cerumen, bilateral: Secondary | ICD-10-CM | POA: Diagnosis not present

## 2023-09-04 ENCOUNTER — Encounter: Payer: BC Managed Care – PPO | Admitting: Nurse Practitioner

## 2023-10-31 DIAGNOSIS — H93293 Other abnormal auditory perceptions, bilateral: Secondary | ICD-10-CM | POA: Diagnosis not present

## 2023-10-31 DIAGNOSIS — H6123 Impacted cerumen, bilateral: Secondary | ICD-10-CM | POA: Diagnosis not present

## 2023-10-31 DIAGNOSIS — H9313 Tinnitus, bilateral: Secondary | ICD-10-CM | POA: Diagnosis not present

## 2023-12-11 DIAGNOSIS — E7800 Pure hypercholesterolemia, unspecified: Secondary | ICD-10-CM | POA: Diagnosis not present

## 2023-12-11 DIAGNOSIS — R7989 Other specified abnormal findings of blood chemistry: Secondary | ICD-10-CM | POA: Diagnosis not present

## 2023-12-11 DIAGNOSIS — M1711 Unilateral primary osteoarthritis, right knee: Secondary | ICD-10-CM | POA: Diagnosis not present

## 2023-12-11 DIAGNOSIS — Z6827 Body mass index (BMI) 27.0-27.9, adult: Secondary | ICD-10-CM | POA: Diagnosis not present

## 2023-12-13 DIAGNOSIS — D225 Melanocytic nevi of trunk: Secondary | ICD-10-CM | POA: Diagnosis not present

## 2023-12-13 DIAGNOSIS — L570 Actinic keratosis: Secondary | ICD-10-CM | POA: Diagnosis not present

## 2023-12-13 DIAGNOSIS — X32XXXD Exposure to sunlight, subsequent encounter: Secondary | ICD-10-CM | POA: Diagnosis not present

## 2023-12-13 DIAGNOSIS — L821 Other seborrheic keratosis: Secondary | ICD-10-CM | POA: Diagnosis not present

## 2023-12-13 DIAGNOSIS — B078 Other viral warts: Secondary | ICD-10-CM | POA: Diagnosis not present

## 2023-12-18 DIAGNOSIS — R7989 Other specified abnormal findings of blood chemistry: Secondary | ICD-10-CM | POA: Diagnosis not present

## 2023-12-18 DIAGNOSIS — Z6827 Body mass index (BMI) 27.0-27.9, adult: Secondary | ICD-10-CM | POA: Diagnosis not present

## 2023-12-18 DIAGNOSIS — E7800 Pure hypercholesterolemia, unspecified: Secondary | ICD-10-CM | POA: Diagnosis not present

## 2023-12-18 DIAGNOSIS — Z7689 Persons encountering health services in other specified circumstances: Secondary | ICD-10-CM | POA: Diagnosis not present

## 2023-12-18 DIAGNOSIS — Z131 Encounter for screening for diabetes mellitus: Secondary | ICD-10-CM | POA: Diagnosis not present

## 2023-12-18 DIAGNOSIS — M1711 Unilateral primary osteoarthritis, right knee: Secondary | ICD-10-CM | POA: Diagnosis not present

## 2024-01-04 DIAGNOSIS — B078 Other viral warts: Secondary | ICD-10-CM | POA: Diagnosis not present
